# Patient Record
Sex: Female | Born: 1955 | Race: White | Hispanic: No | Marital: Married | Smoking: Former smoker
Health system: Southern US, Community
[De-identification: ages and names within clinical notes are randomized; demographics above are authoritative.]

## PROBLEM LIST (undated history)

## (undated) DIAGNOSIS — Z98811 Dental restoration status: Secondary | ICD-10-CM

## (undated) DIAGNOSIS — Z8719 Personal history of other diseases of the digestive system: Secondary | ICD-10-CM

## (undated) DIAGNOSIS — M1990 Unspecified osteoarthritis, unspecified site: Secondary | ICD-10-CM

## (undated) DIAGNOSIS — K219 Gastro-esophageal reflux disease without esophagitis: Secondary | ICD-10-CM

## (undated) DIAGNOSIS — M19012 Primary osteoarthritis, left shoulder: Secondary | ICD-10-CM

## (undated) HISTORY — PX: TOTAL SHOULDER REPLACEMENT: SUR1217

## (undated) HISTORY — PX: ABDOMINAL HYSTERECTOMY: SHX81

---

## 2000-04-27 ENCOUNTER — Other Ambulatory Visit: Admission: RE | Admit: 2000-04-27 | Discharge: 2000-04-27 | Payer: Self-pay | Admitting: Gynecology

## 2000-04-27 ENCOUNTER — Encounter: Admission: RE | Admit: 2000-04-27 | Discharge: 2000-04-27 | Payer: Self-pay | Admitting: Gynecology

## 2000-04-27 ENCOUNTER — Encounter: Payer: Self-pay | Admitting: Gynecology

## 2001-05-11 ENCOUNTER — Other Ambulatory Visit: Admission: RE | Admit: 2001-05-11 | Discharge: 2001-05-11 | Payer: Self-pay | Admitting: Gynecology

## 2001-05-11 ENCOUNTER — Encounter: Payer: Self-pay | Admitting: Gynecology

## 2001-05-11 ENCOUNTER — Encounter: Admission: RE | Admit: 2001-05-11 | Discharge: 2001-05-11 | Payer: Self-pay | Admitting: Gynecology

## 2001-08-26 ENCOUNTER — Ambulatory Visit (HOSPITAL_COMMUNITY): Admission: RE | Admit: 2001-08-26 | Discharge: 2001-08-26 | Payer: Self-pay

## 2001-08-26 ENCOUNTER — Encounter: Payer: Self-pay | Admitting: *Deleted

## 2002-06-22 ENCOUNTER — Other Ambulatory Visit: Admission: RE | Admit: 2002-06-22 | Discharge: 2002-06-22 | Payer: Self-pay | Admitting: Gynecology

## 2002-06-22 ENCOUNTER — Encounter: Payer: Self-pay | Admitting: Gynecology

## 2002-06-22 ENCOUNTER — Encounter: Admission: RE | Admit: 2002-06-22 | Discharge: 2002-06-22 | Payer: Self-pay | Admitting: Gynecology

## 2003-07-07 ENCOUNTER — Encounter: Payer: Self-pay | Admitting: Gynecology

## 2003-07-07 ENCOUNTER — Other Ambulatory Visit: Admission: RE | Admit: 2003-07-07 | Discharge: 2003-07-07 | Payer: Self-pay | Admitting: Gynecology

## 2003-07-07 ENCOUNTER — Encounter: Admission: RE | Admit: 2003-07-07 | Discharge: 2003-07-07 | Payer: Self-pay | Admitting: Gynecology

## 2004-08-12 ENCOUNTER — Other Ambulatory Visit: Admission: RE | Admit: 2004-08-12 | Discharge: 2004-08-12 | Payer: Self-pay | Admitting: Gynecology

## 2004-09-13 ENCOUNTER — Encounter: Admission: RE | Admit: 2004-09-13 | Discharge: 2004-09-13 | Payer: Self-pay | Admitting: Gynecology

## 2005-09-10 ENCOUNTER — Other Ambulatory Visit: Admission: RE | Admit: 2005-09-10 | Discharge: 2005-09-10 | Payer: Self-pay | Admitting: Gynecology

## 2005-12-16 ENCOUNTER — Encounter: Admission: RE | Admit: 2005-12-16 | Discharge: 2005-12-16 | Payer: Self-pay | Admitting: Gynecology

## 2006-10-12 ENCOUNTER — Other Ambulatory Visit: Admission: RE | Admit: 2006-10-12 | Discharge: 2006-10-12 | Payer: Self-pay | Admitting: Gynecology

## 2006-11-12 ENCOUNTER — Ambulatory Visit (HOSPITAL_COMMUNITY): Admission: RE | Admit: 2006-11-12 | Discharge: 2006-11-12 | Payer: Self-pay | Admitting: Family Medicine

## 2007-02-12 ENCOUNTER — Encounter: Admission: RE | Admit: 2007-02-12 | Discharge: 2007-02-12 | Payer: Self-pay | Admitting: Gynecology

## 2008-02-25 ENCOUNTER — Encounter: Admission: RE | Admit: 2008-02-25 | Discharge: 2008-02-25 | Payer: Self-pay | Admitting: Gynecology

## 2008-06-09 ENCOUNTER — Ambulatory Visit (HOSPITAL_BASED_OUTPATIENT_CLINIC_OR_DEPARTMENT_OTHER): Admission: RE | Admit: 2008-06-09 | Discharge: 2008-06-09 | Payer: Self-pay | Admitting: Gynecology

## 2008-06-09 ENCOUNTER — Encounter (INDEPENDENT_AMBULATORY_CARE_PROVIDER_SITE_OTHER): Payer: Self-pay | Admitting: Gynecology

## 2008-06-09 HISTORY — PX: HYSTEROSCOPY W/ ENDOMETRIAL ABLATION: SUR665

## 2009-03-30 ENCOUNTER — Encounter: Admission: RE | Admit: 2009-03-30 | Discharge: 2009-03-30 | Payer: Self-pay | Admitting: Gynecology

## 2009-12-04 ENCOUNTER — Emergency Department (HOSPITAL_COMMUNITY): Admission: EM | Admit: 2009-12-04 | Discharge: 2009-12-05 | Payer: Self-pay | Admitting: Emergency Medicine

## 2009-12-07 ENCOUNTER — Encounter: Admission: RE | Admit: 2009-12-07 | Discharge: 2009-12-07 | Payer: Self-pay | Admitting: Gynecology

## 2009-12-17 ENCOUNTER — Ambulatory Visit (HOSPITAL_BASED_OUTPATIENT_CLINIC_OR_DEPARTMENT_OTHER): Admission: RE | Admit: 2009-12-17 | Discharge: 2009-12-18 | Payer: Self-pay | Admitting: Gynecology

## 2009-12-17 HISTORY — PX: SUPRACERVICAL ABDOMINAL HYSTERECTOMY: SHX5393

## 2009-12-17 HISTORY — PX: BILATERAL SALPINGOOPHORECTOMY: SHX1223

## 2010-07-18 ENCOUNTER — Encounter: Admission: RE | Admit: 2010-07-18 | Discharge: 2010-07-18 | Payer: Self-pay | Admitting: Gynecology

## 2010-11-24 ENCOUNTER — Encounter: Payer: Self-pay | Admitting: Family Medicine

## 2011-01-22 LAB — CBC
HCT: 37.7 % (ref 36.0–46.0)
Hemoglobin: 12.8 g/dL (ref 12.0–15.0)
MCHC: 34.1 g/dL (ref 30.0–36.0)
RBC: 4.67 MIL/uL (ref 3.87–5.11)

## 2011-01-22 LAB — POCT I-STAT, CHEM 8
Creatinine, Ser: 0.9 mg/dL (ref 0.4–1.2)
Glucose, Bld: 95 mg/dL (ref 70–99)
Hemoglobin: 12.9 g/dL (ref 12.0–15.0)
TCO2: 26 mmol/L (ref 0–100)

## 2011-01-22 LAB — DIFFERENTIAL
Eosinophils Absolute: 0.1 10*3/uL (ref 0.0–0.7)
Eosinophils Relative: 0 % (ref 0–5)
Lymphocytes Relative: 10 % — ABNORMAL LOW (ref 12–46)
Neutro Abs: 10.8 10*3/uL — ABNORMAL HIGH (ref 1.7–7.7)

## 2011-01-22 LAB — URINALYSIS, ROUTINE W REFLEX MICROSCOPIC
Nitrite: NEGATIVE
Specific Gravity, Urine: 1.015 (ref 1.005–1.030)
Urobilinogen, UA: 0.2 mg/dL (ref 0.0–1.0)
pH: 7 (ref 5.0–8.0)

## 2011-01-22 LAB — URINE MICROSCOPIC-ADD ON

## 2011-01-22 LAB — POCT PREGNANCY, URINE: Preg Test, Ur: NEGATIVE

## 2011-03-18 NOTE — Op Note (Signed)
NAME:  Laura Page, Laura Page NO.:  [id number]   MEDICAL RECORD NO.:  [id number]          PATIENT TYPE:  AMB   LOCATION:  [facility]                         FACILITY:  [facility]   PHYSICIAN:  Gretta Cool, M.D. DATE OF BIRTH:  01-Oct-1956   DATE OF PROCEDURE:  06/09/2008  DATE OF DISCHARGE:                               OPERATIVE REPORT   PREOPERATIVE DIAGNOSIS:  Abnormal uterine bleeding with polyps and  fibroids by ultrasound.  Benign endometrial pathology by sampling by  dilation and curettage.   POSTOPERATIVE DIAGNOSIS:  Abnormal uterine bleeding with polyps and  fibroids by ultrasound.  Benign endometrial pathology by sampling by  dilation and curettage.   OPERATION PERFORMED:  Hysterectomy, resection of endometrial polyps and  uterine leiomyomata.  Total endometrial resection for ablation.   SURGEON:  Gretta Cool, M.D.   ANESTHESIA:  IV sedation plus Xylocaine 1% paracervical block.   DESCRIPTION OF PROCEDURE:  Under excellent anesthesia as above with the  patient prepped and draped in Allen stirrups with the bladder drained by  rubber catheter, the cervix was grasped with a single toothed tenaculum  and pulled down into view.  It was progressively dilated with a series  of Pratt dilators to a 33.  At this point the 7 mm hysteroscope was  introduced and the cavity photographed.  Large polyps were noted.  They  were progressively resected.  During the resection a large fibroid was  also encountered and removed in total.  At the end of the procedure,  with the endometrial tissue resected and no evidence of residual  endometrial tissue, VaporTrode was applied and the entire cavity treated  to eliminate any superficial unseen islands of endometriosis of  adenomyosis.  At this point the procedure was terminated without  significant bleeding at reduced pressure with cavity cleaned of debris.  The patient was then returned to the recovery room in excellent   condition.           ______________________________  Gretta Cool, M.D.     CWL/MEDQ  D:  06/09/2008  T:  06/09/2008  Job:  [id number]

## 2011-06-03 IMAGING — US US PELVIS COMPLETE MODIFY
1 series · 14 of 25 positions shown · non-contrast
Comparison: None.

CLINICAL DATA: Pelvic pain.

TRANSABDOMINAL AND TRANSVAGINAL ULTRASOUND OF PELVIS
TECHNIQUE: Both transabdominal and transvaginal ultrasound
examinations of the pelvis were performed including evaluation of
the uterus, ovaries, adnexal regions, and pelvic cul-de-sac.

[Series 1: us pelvis complete modify · 0.33mm/px · 14 of 33 slices shown]
[im 1/33]
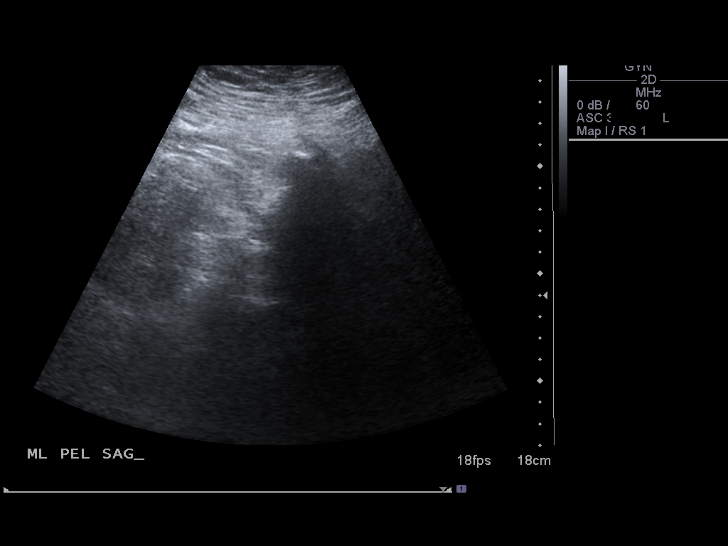
[im 3/33]
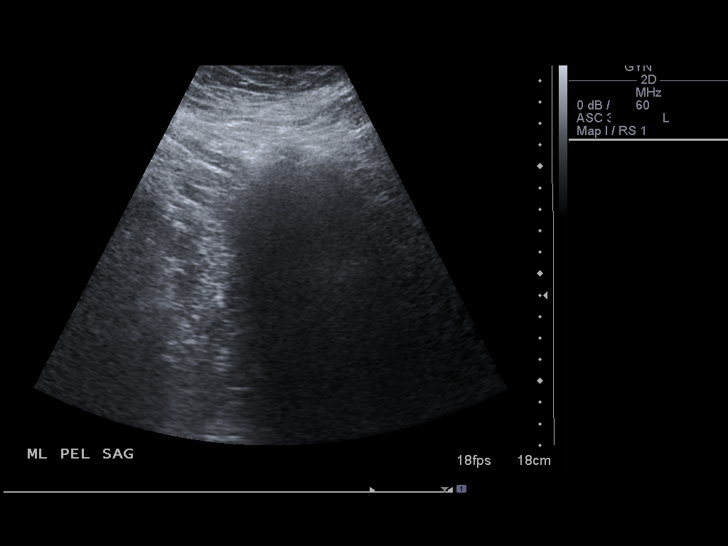
[im 6/33]
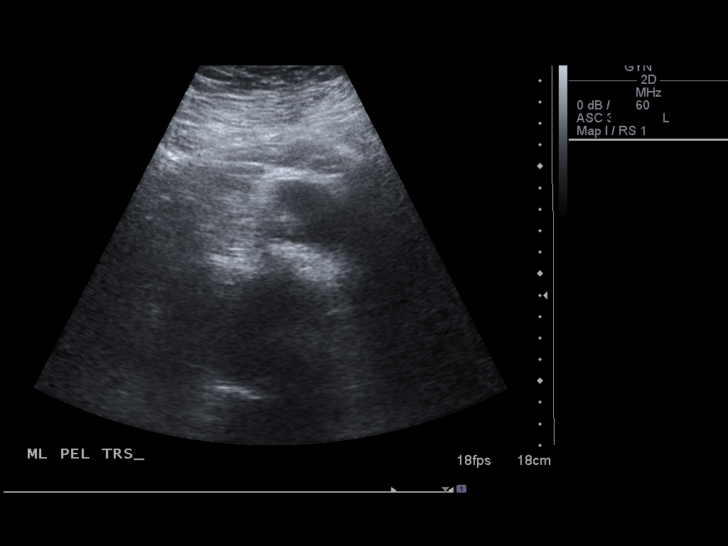
[im 9/33]
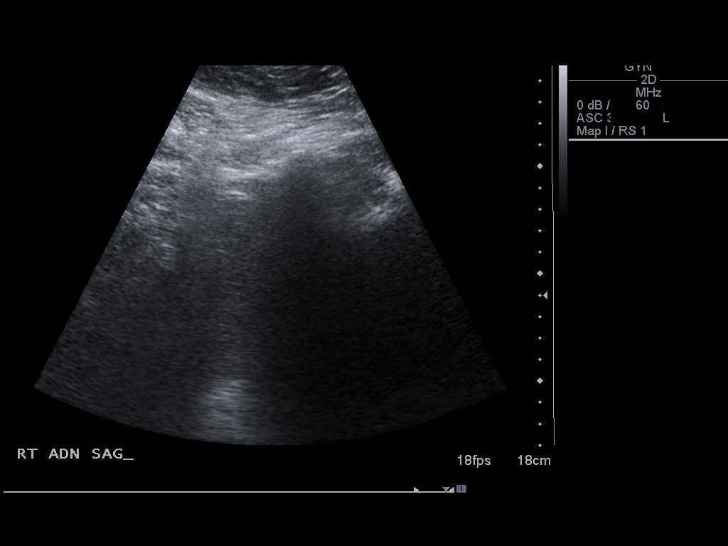
[im 11/33]
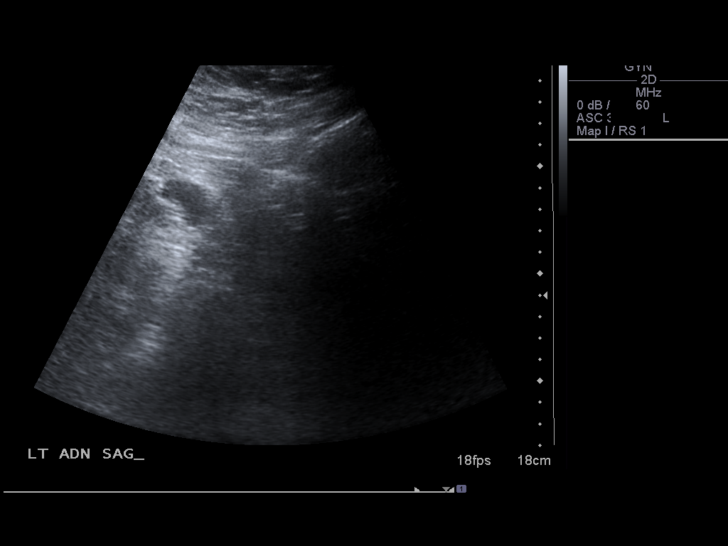
[im 13/33]
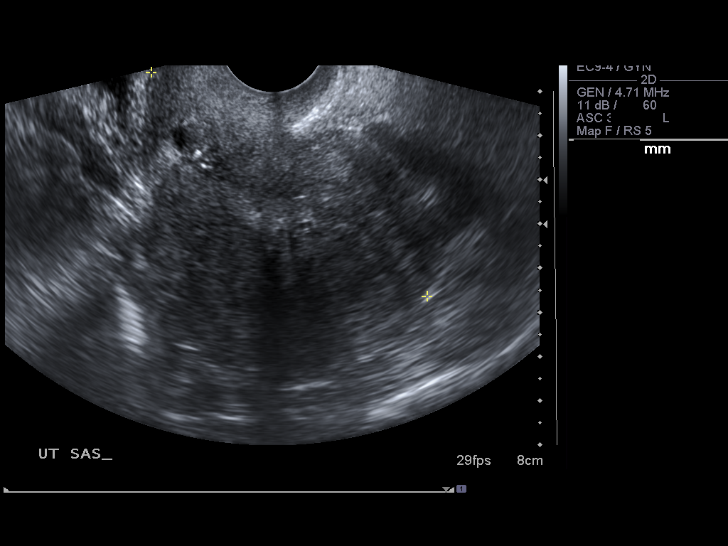
[im 15/33]
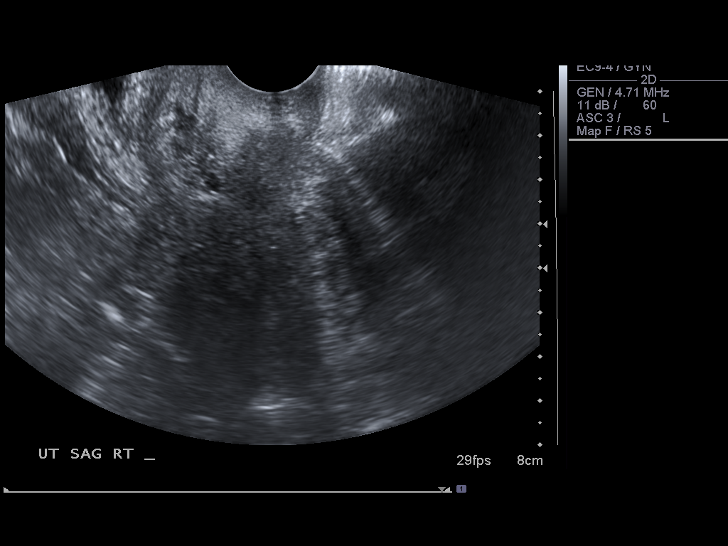
[im 18/33]
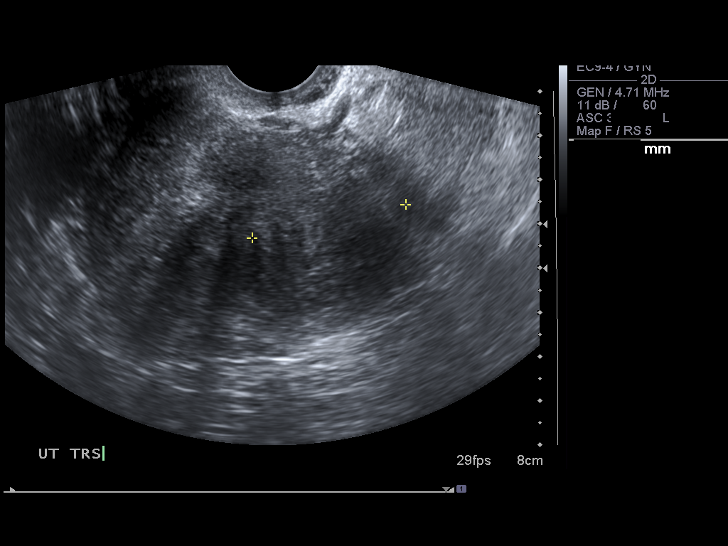
[im 21/33]
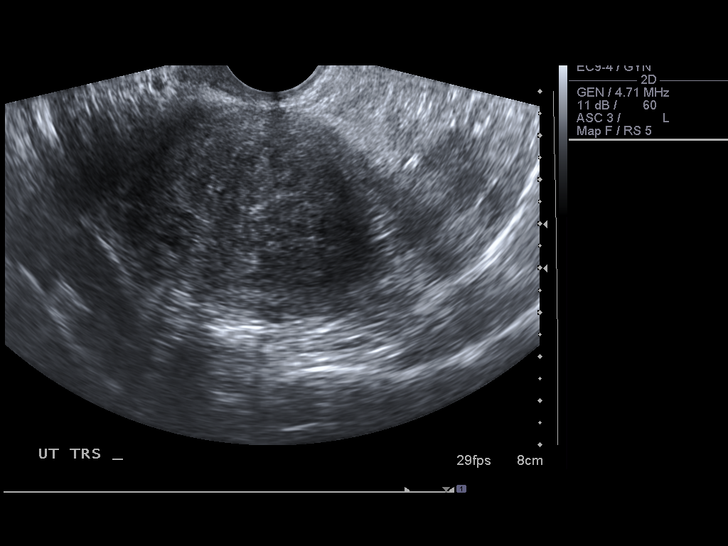
[im 22/33]
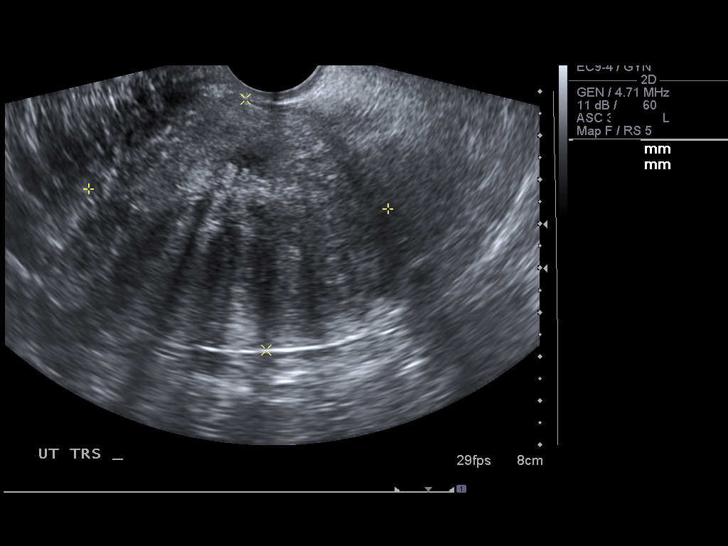
[im 25/33]
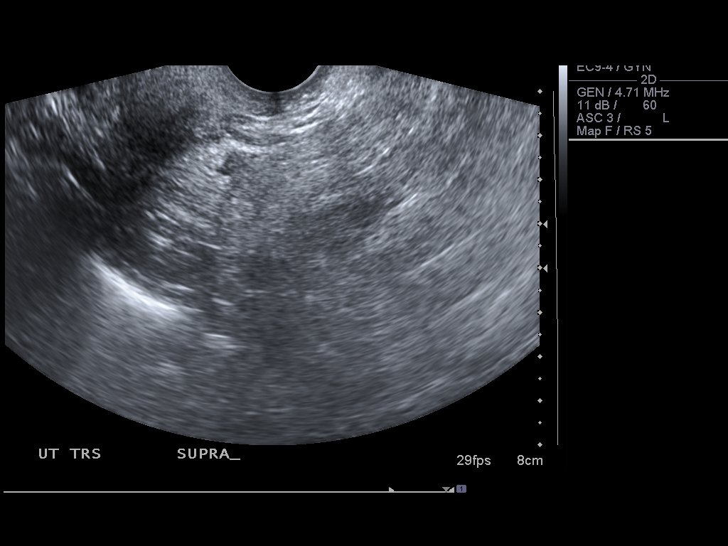
[im 27/33]
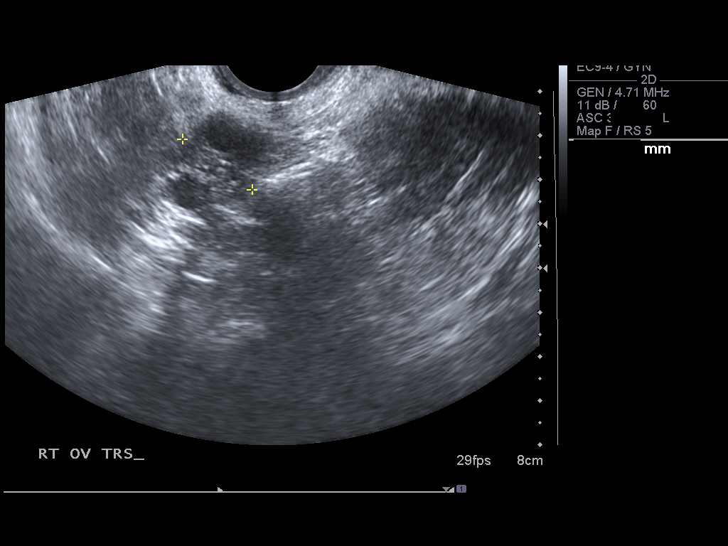
[im 30/33]
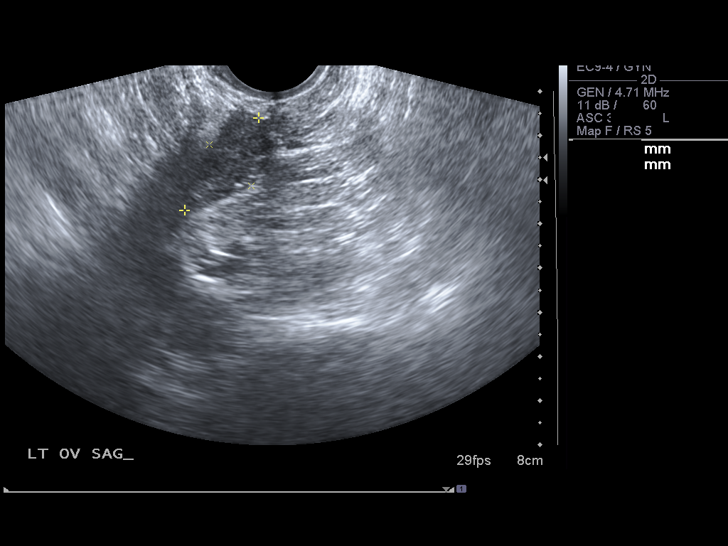
[im 33/33]
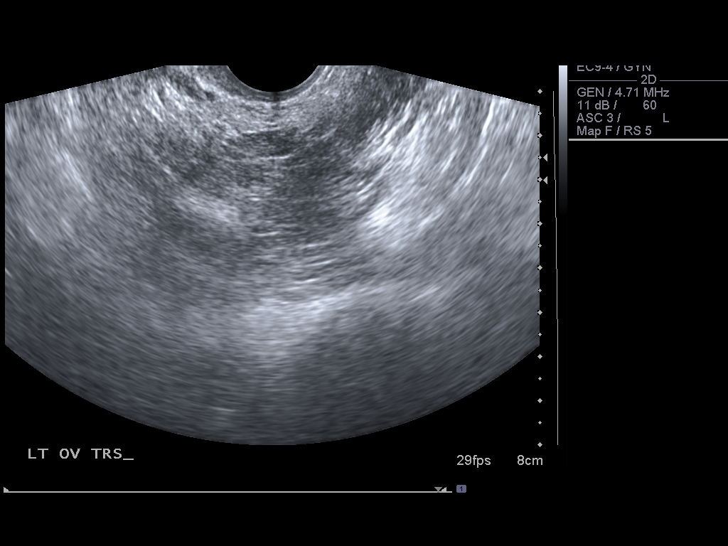

[14 of 25 positions shown; findings below may reference images not displayed]

FINDINGS: Uterus 8 x 5.7 x 6.8 cm.  Fibroid suspected measuring up to 4.6 cm.

Endometrium slightly heterogeneous endometrial lining measuring up
to 5.6 mm.

Right Ovary 3.4 x 2.5 x 1.9 cm.  Small follicles without dominant
mass.

Left Ovary 2.7 x 1.3 x 1.8 cm.  No dominant mass.

Other Findings:  No free fluid.
IMPRESSION: Exam limited by patient's habitus.  Findings suggestive of fibroids
largest measuring up to 4.6 cm.

Slightly heterogeneous endometrial lining with maximal thickness
5.6 mm which is within range normal limits.

## 2011-06-23 ENCOUNTER — Other Ambulatory Visit: Payer: Self-pay | Admitting: Gynecology

## 2011-06-23 DIAGNOSIS — Z1231 Encounter for screening mammogram for malignant neoplasm of breast: Secondary | ICD-10-CM

## 2011-08-01 LAB — POCT HEMOGLOBIN-HEMACUE: Hemoglobin: 10.2 — ABNORMAL LOW

## 2011-08-11 ENCOUNTER — Ambulatory Visit
Admission: RE | Admit: 2011-08-11 | Discharge: 2011-08-11 | Disposition: A | Discharge status: Home / Self Care | Payer: PRIVATE HEALTH INSURANCE | Source: Ambulatory Visit | Attending: Gynecology | Admitting: Gynecology

## 2011-08-11 ENCOUNTER — Other Ambulatory Visit: Payer: Self-pay | Admitting: Gynecology

## 2011-08-11 DIAGNOSIS — Z1231 Encounter for screening mammogram for malignant neoplasm of breast: Secondary | ICD-10-CM

## 2012-08-10 ENCOUNTER — Other Ambulatory Visit: Payer: Self-pay | Admitting: Gynecology

## 2012-08-10 DIAGNOSIS — Z1231 Encounter for screening mammogram for malignant neoplasm of breast: Secondary | ICD-10-CM

## 2012-08-23 ENCOUNTER — Other Ambulatory Visit: Payer: Self-pay | Admitting: Gynecology

## 2012-08-23 DIAGNOSIS — E8940 Asymptomatic postprocedural ovarian failure: Secondary | ICD-10-CM

## 2012-09-15 ENCOUNTER — Ambulatory Visit
Admission: RE | Admit: 2012-09-15 | Discharge: 2012-09-15 | Disposition: A | Discharge status: Home / Self Care | Payer: BC Managed Care – PPO | Source: Ambulatory Visit | Attending: Gynecology | Admitting: Gynecology

## 2012-09-15 DIAGNOSIS — Z1231 Encounter for screening mammogram for malignant neoplasm of breast: Secondary | ICD-10-CM

## 2012-09-15 DIAGNOSIS — E8940 Asymptomatic postprocedural ovarian failure: Secondary | ICD-10-CM

## 2013-09-06 ENCOUNTER — Other Ambulatory Visit: Payer: Self-pay

## 2013-09-06 DIAGNOSIS — Z1231 Encounter for screening mammogram for malignant neoplasm of breast: Secondary | ICD-10-CM

## 2013-09-30 ENCOUNTER — Ambulatory Visit
Admission: RE | Admit: 2013-09-30 | Discharge: 2013-09-30 | Disposition: A | Discharge status: Home / Self Care | Payer: BC Managed Care – PPO | Source: Ambulatory Visit

## 2013-09-30 DIAGNOSIS — Z1231 Encounter for screening mammogram for malignant neoplasm of breast: Secondary | ICD-10-CM

## 2014-09-21 ENCOUNTER — Other Ambulatory Visit: Payer: Self-pay

## 2014-09-21 DIAGNOSIS — Z1231 Encounter for screening mammogram for malignant neoplasm of breast: Secondary | ICD-10-CM

## 2015-09-26 ENCOUNTER — Other Ambulatory Visit (HOSPITAL_BASED_OUTPATIENT_CLINIC_OR_DEPARTMENT_OTHER): Payer: Self-pay | Admitting: Family Medicine

## 2015-09-26 DIAGNOSIS — R42 Dizziness and giddiness: Secondary | ICD-10-CM

## 2015-09-26 DIAGNOSIS — R51 Headache: Secondary | ICD-10-CM

## 2015-09-26 DIAGNOSIS — R519 Headache, unspecified: Secondary | ICD-10-CM

## 2015-09-29 ENCOUNTER — Ambulatory Visit (HOSPITAL_BASED_OUTPATIENT_CLINIC_OR_DEPARTMENT_OTHER)
Admission: RE | Admit: 2015-09-29 | Discharge: 2015-09-29 | Disposition: A | Discharge status: Home / Self Care | Payer: 59 | Source: Ambulatory Visit | Attending: Family Medicine | Admitting: Family Medicine

## 2015-09-29 DIAGNOSIS — R110 Nausea: Secondary | ICD-10-CM | POA: Insufficient documentation

## 2015-09-29 DIAGNOSIS — R519 Headache, unspecified: Secondary | ICD-10-CM

## 2015-09-29 DIAGNOSIS — R42 Dizziness and giddiness: Secondary | ICD-10-CM | POA: Diagnosis present

## 2015-09-29 DIAGNOSIS — R51 Headache: Secondary | ICD-10-CM | POA: Diagnosis not present

## 2015-09-29 DIAGNOSIS — R9082 White matter disease, unspecified: Secondary | ICD-10-CM | POA: Diagnosis not present

## 2015-09-29 MED ORDER — GADOBENATE DIMEGLUMINE 529 MG/ML IV SOLN
20.0000 mL | Freq: Once | INTRAVENOUS | Status: AC | PRN
  Administered 2015-09-29: 20 mL via INTRAVENOUS

## 2015-10-18 ENCOUNTER — Other Ambulatory Visit: Payer: Self-pay | Admitting: Gastroenterology

## 2016-11-03 DIAGNOSIS — M19012 Primary osteoarthritis, left shoulder: Secondary | ICD-10-CM

## 2016-11-03 HISTORY — DX: Primary osteoarthritis, left shoulder: M19.012

## 2016-11-10 NOTE — H&P (Signed)
This is a pleasant 61 year old female who presents to our clinic today with left shoulder pain. This has been ongoing for the past 2 years; insidious onset, no known injury. She feels pain deep in the socket, she says. She is having resting night pain and pain with any motion to include external and internal rotation, as well as forward flexion. When she brings her arm across her body it seems to somewhat alleviate her pain for the time being. She has tried Advil without relief of symptoms. No numbness, tingling, or burning down her arm. No history of surgical intervention or cortisone injection in the past. Of note, she does state her brother had a total shoulder replacement with Dr. Richardson Landryan Murphy back in December 2014 and is doing well with this.   Past medical history: Significant for glasses. Negative for diabetes, hypertension, heart disease, arthritis.  Allergies; No known drug allergies.  Medications: Prilosec as needed.  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 She is married. She is a nonsmoker. She drinks one alcoholic beverage per week.    Family history: Negative for diabetes, hypertension, heart disease, arthritis.  Review of systems as detailed on HPI; all others reviewed and are negative.    EXAMINATION: Well-developed, well-nourished female in no acute distress.  Alert and oriented x 3. Height 5'8". Weight 230 pounds. BP 126/94. Heart rate 80.  Examination of her left shoulder reveals 50% active range of motion in all planes. She can internally rotate to her back pocket. Positive empty can. The patient is neurovascularly intact distally.   X-RAYS: X-rays of her cervical spine reveal moderate degenerative changes, degenerative disc disease throughout.   X-rays of her shoulder reveal type 2 acromion, moderate AC changes, severe glenohumeral osteoarthritis with associated loose body.   IMPRESSION: Primary localized osteoarthritis, left shoulder.  PLAN: Proceed with 2:6  Depo Medrol/Marcaine injection into the intraarticular space on the left.   Of note, Santina EvansCatherine feels a lot better 10 minutes following the injection however her motion is about the same. We are going to go ahead and get an MRI of her left shoulder to assess her rotator cuff. In the meantime, we are going to fill out paperwork to proceed with a left total shoulder replacement. Risks, benefits, possible complications reviewed. Rehab was discussed. All questions were answered, paperwork completed. We will be in touch with Santina Evansatherine following the MRI.   MRI reveals severe glenohumeral joint osteoarthritis

## 2016-11-13 ENCOUNTER — Encounter (HOSPITAL_BASED_OUTPATIENT_CLINIC_OR_DEPARTMENT_OTHER): Payer: Self-pay | Admitting: *Deleted

## 2016-11-14 ENCOUNTER — Encounter (HOSPITAL_BASED_OUTPATIENT_CLINIC_OR_DEPARTMENT_OTHER)
Admission: RE | Admit: 2016-11-14 | Discharge: 2016-11-14 | Disposition: A | Discharge status: Home / Self Care | Payer: 59 | Source: Ambulatory Visit | Attending: Orthopedic Surgery | Admitting: Orthopedic Surgery

## 2016-11-14 ENCOUNTER — Other Ambulatory Visit: Payer: Self-pay

## 2016-11-14 ENCOUNTER — Encounter (HOSPITAL_BASED_OUTPATIENT_CLINIC_OR_DEPARTMENT_OTHER): Payer: Self-pay | Admitting: *Deleted

## 2016-11-14 DIAGNOSIS — M19012 Primary osteoarthritis, left shoulder: Secondary | ICD-10-CM | POA: Diagnosis not present

## 2016-11-14 DIAGNOSIS — Z01810 Encounter for preprocedural cardiovascular examination: Secondary | ICD-10-CM | POA: Diagnosis not present

## 2016-11-14 DIAGNOSIS — Z01812 Encounter for preprocedural laboratory examination: Secondary | ICD-10-CM | POA: Insufficient documentation

## 2016-11-14 LAB — SURGICAL PCR SCREEN
MRSA, PCR: NEGATIVE
Staphylococcus aureus: NEGATIVE

## 2016-11-14 NOTE — Pre-Procedure Instructions (Signed)
To come for EKG and PCR screen

## 2016-11-17 NOTE — Progress Notes (Signed)
PCR negative result 11/14/16.

## 2016-11-27 ENCOUNTER — Ambulatory Visit (HOSPITAL_BASED_OUTPATIENT_CLINIC_OR_DEPARTMENT_OTHER): Payer: 59 | Admitting: Anesthesiology

## 2016-11-27 ENCOUNTER — Encounter (HOSPITAL_BASED_OUTPATIENT_CLINIC_OR_DEPARTMENT_OTHER)
Admission: RE | Disposition: A | Discharge status: Home / Self Care | Payer: Self-pay | Source: Ambulatory Visit | Attending: Orthopedic Surgery

## 2016-11-27 ENCOUNTER — Ambulatory Visit (HOSPITAL_BASED_OUTPATIENT_CLINIC_OR_DEPARTMENT_OTHER)
Admission: RE | Admit: 2016-11-27 | Discharge: 2016-11-28 | Disposition: A | Discharge status: Home / Self Care | Payer: 59 | Source: Ambulatory Visit | Attending: Orthopedic Surgery | Admitting: Orthopedic Surgery

## 2016-11-27 ENCOUNTER — Encounter (HOSPITAL_BASED_OUTPATIENT_CLINIC_OR_DEPARTMENT_OTHER): Payer: Self-pay | Admitting: *Deleted

## 2016-11-27 DIAGNOSIS — Z79899 Other long term (current) drug therapy: Secondary | ICD-10-CM | POA: Insufficient documentation

## 2016-11-27 DIAGNOSIS — E669 Obesity, unspecified: Secondary | ICD-10-CM | POA: Insufficient documentation

## 2016-11-27 DIAGNOSIS — Z6835 Body mass index (BMI) 35.0-35.9, adult: Secondary | ICD-10-CM | POA: Insufficient documentation

## 2016-11-27 DIAGNOSIS — K219 Gastro-esophageal reflux disease without esophagitis: Secondary | ICD-10-CM | POA: Diagnosis not present

## 2016-11-27 DIAGNOSIS — M19012 Primary osteoarthritis, left shoulder: Secondary | ICD-10-CM | POA: Diagnosis present

## 2016-11-27 DIAGNOSIS — Z87891 Personal history of nicotine dependence: Secondary | ICD-10-CM | POA: Insufficient documentation

## 2016-11-27 DIAGNOSIS — M7582 Other shoulder lesions, left shoulder: Secondary | ICD-10-CM | POA: Insufficient documentation

## 2016-11-27 HISTORY — DX: Unspecified osteoarthritis, unspecified site: M19.90

## 2016-11-27 HISTORY — DX: Gastro-esophageal reflux disease without esophagitis: K21.9

## 2016-11-27 HISTORY — DX: Dental restoration status: Z98.811

## 2016-11-27 HISTORY — PX: TOTAL SHOULDER ARTHROPLASTY: SHX126

## 2016-11-27 HISTORY — DX: Primary osteoarthritis, left shoulder: M19.012

## 2016-11-27 LAB — POCT HEMOGLOBIN-HEMACUE: Hemoglobin: 14.9 g/dL (ref 12.0–15.0)

## 2016-11-27 SURGERY — ARTHROPLASTY, SHOULDER, TOTAL
Anesthesia: Regional | Site: Shoulder | Laterality: Left

## 2016-11-27 MED ORDER — METHOCARBAMOL 500 MG PO TABS
500.0000 mg | ORAL_TABLET | Freq: Four times a day (QID) | ORAL | Status: DC | PRN
  Administered 2016-11-27 – 2016-11-28 (×3): 500 mg via ORAL
  Filled 2016-11-27 (×3): qty 1

## 2016-11-27 MED ORDER — METOCLOPRAMIDE HCL 5 MG PO TABS: 5.0000 mg | ORAL_TABLET | Freq: Three times a day (TID) | ORAL | Status: DC | PRN

## 2016-11-27 MED ORDER — BUPIVACAINE-EPINEPHRINE (PF) 0.5% -1:200000 IJ SOLN
INTRAMUSCULAR | Status: DC | PRN
  Administered 2016-11-27: 30 mL via PERINEURAL

## 2016-11-27 MED ORDER — HYDROMORPHONE HCL 1 MG/ML IJ SOLN
INTRAMUSCULAR | Status: AC
  Filled 2016-11-27: qty 1

## 2016-11-27 MED ORDER — METOCLOPRAMIDE HCL 5 MG/ML IJ SOLN: 5.0000 mg | Freq: Three times a day (TID) | INTRAMUSCULAR | Status: DC | PRN

## 2016-11-27 MED ORDER — EPHEDRINE SULFATE 50 MG/ML IJ SOLN
INTRAMUSCULAR | Status: DC | PRN
  Administered 2016-11-27 (×2): 10 mg via INTRAVENOUS

## 2016-11-27 MED ORDER — LACTATED RINGERS IV SOLN
INTRAVENOUS | Status: DC
  Administered 2016-11-27: 07:00:00 via INTRAVENOUS

## 2016-11-27 MED ORDER — MIDAZOLAM HCL 2 MG/2ML IJ SOLN: 0.5000 mg | Freq: Once | INTRAMUSCULAR | Status: DC | PRN

## 2016-11-27 MED ORDER — BISACODYL 5 MG PO TBEC: 5.0000 mg | DELAYED_RELEASE_TABLET | Freq: Every day | ORAL | Status: DC | PRN

## 2016-11-27 MED ORDER — PROPOFOL 500 MG/50ML IV EMUL
INTRAVENOUS | Status: AC
  Filled 2016-11-27: qty 50

## 2016-11-27 MED ORDER — KETOROLAC TROMETHAMINE 30 MG/ML IJ SOLN
INTRAMUSCULAR | Status: AC
  Filled 2016-11-27: qty 1

## 2016-11-27 MED ORDER — METHOCARBAMOL 500 MG PO TABS
500.0000 mg | ORAL_TABLET | Freq: Four times a day (QID) | ORAL | 0 refills | Status: DC
Start: 2016-11-27 — End: 2019-10-20

## 2016-11-27 MED ORDER — ONDANSETRON HCL 4 MG/2ML IJ SOLN
4.0000 mg | Freq: Four times a day (QID) | INTRAMUSCULAR | Status: DC | PRN
  Administered 2016-11-27: 4 mg via INTRAVENOUS
  Filled 2016-11-27: qty 2

## 2016-11-27 MED ORDER — KETOROLAC TROMETHAMINE 30 MG/ML IJ SOLN
30.0000 mg | Freq: Once | INTRAMUSCULAR | Status: AC | PRN
  Administered 2016-11-27: 30 mg via INTRAVENOUS

## 2016-11-27 MED ORDER — MEPERIDINE HCL 25 MG/ML IJ SOLN: 6.2500 mg | INTRAMUSCULAR | Status: DC | PRN

## 2016-11-27 MED ORDER — ONDANSETRON HCL 4 MG PO TABS: 4.0000 mg | ORAL_TABLET | Freq: Three times a day (TID) | ORAL | 0 refills | Status: DC | PRN

## 2016-11-27 MED ORDER — POTASSIUM CHLORIDE IN NACL 20-0.9 MEQ/L-% IV SOLN
INTRAVENOUS | Status: DC
  Administered 2016-11-27: 12:00:00 via INTRAVENOUS
  Filled 2016-11-27: qty 1000

## 2016-11-27 MED ORDER — POLYETHYLENE GLYCOL 3350 17 G PO PACK: 17.0000 g | PACK | Freq: Every day | ORAL | Status: DC | PRN

## 2016-11-27 MED ORDER — HYDROMORPHONE HCL 1 MG/ML IJ SOLN
0.2500 mg | INTRAMUSCULAR | Status: DC | PRN
  Administered 2016-11-27 (×3): 0.5 mg via INTRAVENOUS

## 2016-11-27 MED ORDER — CHLORHEXIDINE GLUCONATE 4 % EX LIQD: 60.0000 mL | Freq: Once | CUTANEOUS | Status: DC

## 2016-11-27 MED ORDER — OXYCODONE-ACETAMINOPHEN 5-325 MG PO TABS: 1.0000 | ORAL_TABLET | ORAL | 0 refills | Status: DC | PRN

## 2016-11-27 MED ORDER — ONDANSETRON HCL 4 MG/2ML IJ SOLN
INTRAMUSCULAR | Status: AC
Start: 2016-11-27 — End: 2016-11-27
  Filled 2016-11-27: qty 2

## 2016-11-27 MED ORDER — DEXAMETHASONE SODIUM PHOSPHATE 10 MG/ML IJ SOLN
INTRAMUSCULAR | Status: AC
  Filled 2016-11-27: qty 1

## 2016-11-27 MED ORDER — CEFAZOLIN SODIUM-DEXTROSE 2-4 GM/100ML-% IV SOLN
INTRAVENOUS | Status: AC
  Filled 2016-11-27: qty 100

## 2016-11-27 MED ORDER — PROMETHAZINE HCL 25 MG/ML IJ SOLN: 6.2500 mg | INTRAMUSCULAR | Status: DC | PRN

## 2016-11-27 MED ORDER — LIDOCAINE HCL (CARDIAC) 20 MG/ML IV SOLN
INTRAVENOUS | Status: DC | PRN
  Administered 2016-11-27: 50 mg via INTRAVENOUS

## 2016-11-27 MED ORDER — FENTANYL CITRATE (PF) 100 MCG/2ML IJ SOLN
50.0000 ug | INTRAMUSCULAR | Status: AC | PRN
  Administered 2016-11-27 (×2): 25 ug via INTRAVENOUS
  Administered 2016-11-27 (×2): 50 ug via INTRAVENOUS

## 2016-11-27 MED ORDER — METHOCARBAMOL 1000 MG/10ML IJ SOLN: 500.0000 mg | Freq: Four times a day (QID) | INTRAVENOUS | Status: DC | PRN

## 2016-11-27 MED ORDER — MIDAZOLAM HCL 2 MG/2ML IJ SOLN
INTRAMUSCULAR | Status: AC
  Filled 2016-11-27: qty 2

## 2016-11-27 MED ORDER — DEXAMETHASONE SODIUM PHOSPHATE 4 MG/ML IJ SOLN
INTRAMUSCULAR | Status: DC | PRN
  Administered 2016-11-27: 10 mg via INTRAVENOUS

## 2016-11-27 MED ORDER — CEFAZOLIN SODIUM-DEXTROSE 2-4 GM/100ML-% IV SOLN
2.0000 g | INTRAVENOUS | Status: AC
  Administered 2016-11-27: 2 g via INTRAVENOUS

## 2016-11-27 MED ORDER — SCOPOLAMINE 1 MG/3DAYS TD PT72: 1.0000 | MEDICATED_PATCH | Freq: Once | TRANSDERMAL | Status: DC | PRN

## 2016-11-27 MED ORDER — OXYCODONE-ACETAMINOPHEN 5-325 MG PO TABS
1.0000 | ORAL_TABLET | ORAL | Status: DC | PRN
  Administered 2016-11-27 – 2016-11-28 (×4): 2 via ORAL
  Filled 2016-11-27 (×4): qty 2

## 2016-11-27 MED ORDER — PANTOPRAZOLE SODIUM 40 MG PO TBEC
40.0000 mg | DELAYED_RELEASE_TABLET | Freq: Every day | ORAL | Status: DC
  Administered 2016-11-27: 40 mg via ORAL
  Filled 2016-11-27: qty 1

## 2016-11-27 MED ORDER — HYDROMORPHONE HCL 1 MG/ML IJ SOLN: 0.5000 mg | INTRAMUSCULAR | Status: DC | PRN

## 2016-11-27 MED ORDER — MAGNESIUM CITRATE PO SOLN: 1.0000 | Freq: Once | ORAL | Status: DC | PRN

## 2016-11-27 MED ORDER — MIDAZOLAM HCL 2 MG/2ML IJ SOLN
1.0000 mg | INTRAMUSCULAR | Status: DC | PRN
  Administered 2016-11-27: 1 mg via INTRAVENOUS

## 2016-11-27 MED ORDER — FENTANYL CITRATE (PF) 100 MCG/2ML IJ SOLN
INTRAMUSCULAR | Status: AC
  Filled 2016-11-27: qty 2

## 2016-11-27 MED ORDER — ONDANSETRON HCL 4 MG PO TABS
4.0000 mg | ORAL_TABLET | Freq: Four times a day (QID) | ORAL | Status: DC | PRN
  Filled 2016-11-27: qty 1

## 2016-11-27 MED ORDER — PROPOFOL 10 MG/ML IV BOLUS
INTRAVENOUS | Status: DC | PRN
  Administered 2016-11-27: 200 mg via INTRAVENOUS

## 2016-11-27 MED ORDER — LACTATED RINGERS IV SOLN: INTRAVENOUS | Status: DC

## 2016-11-27 MED ORDER — SUCCINYLCHOLINE CHLORIDE 20 MG/ML IJ SOLN
INTRAMUSCULAR | Status: DC | PRN
  Administered 2016-11-27: 120 mg via INTRAVENOUS

## 2016-11-27 MED ORDER — LIDOCAINE 2% (20 MG/ML) 5 ML SYRINGE
INTRAMUSCULAR | Status: AC
  Filled 2016-11-27: qty 5

## 2016-11-27 SURGICAL SUPPLY — 69 items
ANCH SUT SWLK 19.1X4.75 (Anchor) ×1 IMPLANT
ANCHOR SUT BIO SW 4.75X19.1 (Anchor) ×2 IMPLANT
APL SKNCLS STERI-STRIP NONHPOA (GAUZE/BANDAGES/DRESSINGS) ×1
BENZOIN TINCTURE PRP APPL 2/3 (GAUZE/BANDAGES/DRESSINGS) ×3 IMPLANT
BLADE SAW SAG 29X58X.64 (BLADE) ×2 IMPLANT
BLADE SAW SGTL 83.5X18.5 (BLADE) ×1 IMPLANT
BLADE SURG 10 STRL SS (BLADE) ×3 IMPLANT
BLADE SURG 15 STRL LF DISP TIS (BLADE) ×2 IMPLANT
BLADE SURG 15 STRL SS (BLADE) ×6
BNDG GAUZE ELAST 4 BULKY (GAUZE/BANDAGES/DRESSINGS) ×3 IMPLANT
BOWL SMART MIX CTS (DISPOSABLE) ×3 IMPLANT
CAPT SHLDR TOTAL 2 ×2 IMPLANT
CEMENT BONE SIMPLEX SPEEDSET (Cement) ×3 IMPLANT
CLEANER CAUTERY TIP 5X5 PAD (MISCELLANEOUS) ×1 IMPLANT
CLOSURE WOUND 1/2 X4 (GAUZE/BANDAGES/DRESSINGS) ×1
COVER BACK TABLE 60X90IN (DRAPES) ×3 IMPLANT
COVER MAYO STAND STRL (DRAPES) ×3 IMPLANT
DECANTER SPIKE VIAL GLASS SM (MISCELLANEOUS) IMPLANT
DRAPE U-SHAPE 47X51 STRL (DRAPES) ×3 IMPLANT
DRAPE U-SHAPE 76X120 STRL (DRAPES) ×6 IMPLANT
DRSG MEPILEX BORDER 4X8 (GAUZE/BANDAGES/DRESSINGS) ×2 IMPLANT
DURAPREP 26ML APPLICATOR (WOUND CARE) ×3 IMPLANT
ELECT REM PT RETURN 9FT ADLT (ELECTROSURGICAL) ×3
ELECTRODE REM PT RTRN 9FT ADLT (ELECTROSURGICAL) ×1 IMPLANT
FACESHIELD WRAPAROUND (MASK) ×3 IMPLANT
FACESHIELD WRAPAROUND OR TEAM (MASK) ×1 IMPLANT
GAUZE SPONGE 4X4 12PLY STRL (GAUZE/BANDAGES/DRESSINGS) ×2 IMPLANT
GAUZE XEROFORM 1X8 LF (GAUZE/BANDAGES/DRESSINGS) IMPLANT
GLOVE BIOGEL PI IND STRL 7.0 (GLOVE) ×1 IMPLANT
GLOVE BIOGEL PI INDICATOR 7.0 (GLOVE) ×2
GLOVE ECLIPSE 6.5 STRL STRAW (GLOVE) ×6 IMPLANT
GLOVE ECLIPSE 7.0 STRL STRAW (GLOVE) ×3 IMPLANT
GLOVE SURG ORTHO 8.0 STRL STRW (GLOVE) ×3 IMPLANT
GOWN STRL REUS W/ TWL LRG LVL3 (GOWN DISPOSABLE) ×1 IMPLANT
GOWN STRL REUS W/ TWL XL LVL3 (GOWN DISPOSABLE) ×2 IMPLANT
GOWN STRL REUS W/TWL LRG LVL3 (GOWN DISPOSABLE) ×3
GOWN STRL REUS W/TWL XL LVL3 (GOWN DISPOSABLE) ×6
NDL 1/2 CIR CATGUT .05X1.09 (NEEDLE) IMPLANT
NEEDLE 1/2 CIR CATGUT .05X1.09 (NEEDLE) IMPLANT
NS IRRIG 1000ML POUR BTL (IV SOLUTION) ×4 IMPLANT
PACK ARTHROSCOPY DSU (CUSTOM PROCEDURE TRAY) ×3 IMPLANT
PACK BASIN DAY SURGERY FS (CUSTOM PROCEDURE TRAY) ×3 IMPLANT
PAD CLEANER CAUTERY TIP 5X5 (MISCELLANEOUS) ×2
PENCIL BUTTON HOLSTER BLD 10FT (ELECTRODE) ×3 IMPLANT
SLEEVE SCD COMPRESS KNEE MED (MISCELLANEOUS) ×3 IMPLANT
SLING ARM IMMOBILIZER LRG (SOFTGOODS) ×2 IMPLANT
SLING ARM IMMOBILIZER MED (SOFTGOODS) IMPLANT
SPONGE GAUZE 4X4 12PLY STER LF (GAUZE/BANDAGES/DRESSINGS) IMPLANT
SPONGE LAP 18X18 X RAY DECT (DISPOSABLE) ×6 IMPLANT
STRIP CLOSURE SKIN 1/2X4 (GAUZE/BANDAGES/DRESSINGS) ×2 IMPLANT
SUCTION FRAZIER HANDLE 10FR (MISCELLANEOUS) ×2
SUCTION TUBE FRAZIER 10FR DISP (MISCELLANEOUS) ×1 IMPLANT
SUT FIBERWIRE #2 38 T-5 BLUE (SUTURE) ×15
SUT MNCRL AB 4-0 PS2 18 (SUTURE) IMPLANT
SUT VIC AB 0 CT1 27 (SUTURE) ×3
SUT VIC AB 0 CT1 27XBRD ANBCTR (SUTURE) ×1 IMPLANT
SUT VIC AB 2-0 CT1 27 (SUTURE) ×6
SUT VIC AB 2-0 CT1 TAPERPNT 27 (SUTURE) ×2 IMPLANT
SUT VIC AB 3-0 SH 27 (SUTURE)
SUT VIC AB 3-0 SH 27X BRD (SUTURE) IMPLANT
SUTURE FIBERWR #2 38 T-5 BLUE (SUTURE) ×5 IMPLANT
SYR 50ML LL SCALE MARK (SYRINGE) IMPLANT
SYR BULB IRRIGATION 50ML (SYRINGE) ×3 IMPLANT
TAPE PAPER 3X10 WHT MICROPORE (GAUZE/BANDAGES/DRESSINGS) ×3 IMPLANT
TOWEL OR 17X24 6PK STRL BLUE (TOWEL DISPOSABLE) ×5 IMPLANT
TOWEL OR NON WOVEN STRL DISP B (DISPOSABLE) ×4 IMPLANT
TUBE CONNECTING 20'X1/4 (TUBING)
TUBE CONNECTING 20X1/4 (TUBING) ×1 IMPLANT
YANKAUER SUCT BULB TIP NO VENT (SUCTIONS) ×3 IMPLANT

## 2016-11-27 NOTE — Progress Notes (Signed)
Assisted Dr. Jairo Benarswell Jackson with left, interscalene  block. Side rails up, monitors on throughout procedure. See vital signs in flow sheet. Tolerated Procedure well.

## 2016-11-27 NOTE — Interval H&P Note (Signed)
History and Physical Interval Note:  11/27/2016 7:30 AM  Laura Linatherine S Groene  has presented today for surgery, with the diagnosis of OA left shoulder  The various methods of treatment have been discussed with the patient and family. After consideration of risks, benefits and other options for treatment, the patient has consented to  Procedure(s): TOTAL SHOULDER ARTHROPLASTY (Left) as a surgical intervention .  The patient's history has been reviewed, patient examined, no change in status, stable for surgery.  I have reviewed the patient's chart and labs.  Questions were answered to the patient's satisfaction.     Loreta Aveaniel F Kyriaki Moder

## 2016-11-27 NOTE — Addendum Note (Signed)
Addendum  created 11/27/16 1841 by York Griceonna W Lexington Devine, CRNA   Charge Capture section accepted

## 2016-11-27 NOTE — Anesthesia Procedure Notes (Signed)
Anesthesia Regional Block:  Interscalene brachial plexus block  Pre-Anesthetic Checklist: ,, timeout performed, Correct Patient, Correct Site, Correct Laterality, Correct Procedure, Correct Position, site marked, Risks and benefits discussed,  Surgical consent,  Pre-op evaluation,  At surgeon's request and post-op pain management  Laterality: Left and Upper  Prep: chloraprep       Needles:  Injection technique: Single-shot  Needle Type: Stimulator Needle - 40     Needle Length: 4cm 4 cm Needle Gauge: 22 and 22 G    Additional Needles:  Procedures: nerve stimulator Interscalene brachial plexus block  Nerve Stimulator or Paresthesia:  Response: forearm twitch, 0.4 mA, 0.1 ms,   Additional Responses:   Narrative:  Start time: 11/27/2016 7:03 AM End time: 11/27/2016 7:10 AM Injection made incrementally with aspirations every 5 mL.  Performed by: Personally  Anesthesiologist: Jean RosenthalJACKSON, Edrian Melucci  Additional Notes: Pt identified in Holding room.  Monitors applied. Working IV access confirmed. Sterile prep L neck.  #22ga PNS to forearm twitch at 0.5440mA threshold.  30cc 0.5% Bupivacaine with 1:200k epi injected incrementally after negative test dose.  Patient asymptomatic, VSS, no heme aspirated, tolerated well.  Sandford Craze Shari Natt, MD

## 2016-11-27 NOTE — Anesthesia Preprocedure Evaluation (Signed)
Anesthesia Evaluation  Patient identified by MRN, date of birth, ID band Patient awake    Reviewed: Allergy & Precautions, NPO status , Patient's Chart, lab work & pertinent test results  History of Anesthesia Complications Negative for: history of anesthetic complications  Airway Mallampati: II  TM Distance: >3 FB Neck ROM: Full    Dental  (+) Dental Advisory Given   Pulmonary neg pulmonary ROS, former smoker,    breath sounds clear to auscultation       Cardiovascular (-) anginanegative cardio ROS   Rhythm:Regular Rate:Normal     Neuro/Psych negative neurological ROS     GI/Hepatic Neg liver ROS, GERD  Medicated and Controlled,  Endo/Other  negative endocrine ROS  Renal/GU negative Renal ROS     Musculoskeletal  (+) Arthritis ,   Abdominal (+) + obese,   Peds  Hematology negative hematology ROS (+)   Anesthesia Other Findings   Reproductive/Obstetrics                             Anesthesia Physical Anesthesia Plan  ASA: II  Anesthesia Plan: General   Post-op Pain Management: GA combined w/ Regional for post-op pain   Induction: Intravenous  Airway Management Planned: Oral ETT  Additional Equipment:   Intra-op Plan:   Post-operative Plan: Extubation in OR  Informed Consent: I have reviewed the patients History and Physical, chart, labs and discussed the procedure including the risks, benefits and alternatives for the proposed anesthesia with the patient or authorized representative who has indicated his/her understanding and acceptance.   Dental advisory given  Plan Discussed with: CRNA and Surgeon  Anesthesia Plan Comments: (Plan routine monitors, GETA with interscalene block for post op analgesia)        Anesthesia Quick Evaluation

## 2016-11-27 NOTE — Discharge Instructions (Signed)
°  Care After Instructions Refer to this sheet in the next few weeks. These discharge instructions provide you with general information on caring for yourself after you leave the hospital. Your caregiver may also give you specific instructions. Your treatment has been planned according to the most current medical practices available, but unavoidable complications sometimes occur. If you have any problems or questions after discharge, please call your caregiver. HOME INSTRUCTIONS You may resume a normal diet and activities as directed.  Wear sling at all times.  Do not remove bandage.  Do not get bandage wet. Only take over-the-counter or prescription medicines for pain, discomfort, or fever as directed by your caregiver.  Wear your sling for the next 6 weeks unless otherwise instructed. Eat a well-balanced diet.  Avoid lifting or driving until you are instructed otherwise.  Make an appointment to see your caregiver for stitches (suture) or staple removal one week after surgery.   SEEK MEDICAL CARE IF: You have swelling of your calf or leg.  You develop shortness of breath or chest pain.  You have redness, swelling, or increasing pain in the wound.  There is pus or any unusual drainage coming from the surgical site.  You notice a bad smell coming from the surgical site or dressing.  The surgical site breaks open after sutures or staples have been removed.  There is persistent bleeding from the suture or staple line.  You are getting worse or are not improving.  You have any other questions or concerns.  SEEK IMMEDIATE MEDICAL CARE IF:  You have a fever greater than 101 You develop a rash.  You have difficulty breathing.  You develop any reaction or side effects to medicines given.  Your knee motion is decreasing rather than improving.  MAKE SURE YOU:  Understand these instructions.  Will watch your condition.  Will get help right away if you are not doing well or get worse.    Regional  Anesthesia Blocks  1. Numbness or the inability to move the "blocked" extremity may last from 3-48 hours after placement. The length of time depends on the medication injected and your individual response to the medication. If the numbness is not going away after 48 hours, call your surgeon.  2. The extremity that is blocked will need to be protected until the numbness is gone and the  Strength has returned. Because you cannot feel it, you will need to take extra care to avoid injury. Because it may be weak, you may have difficulty moving it or using it. You may not know what position it is in without looking at it while the block is in effect.  3. For blocks in the legs and feet, returning to weight bearing and walking needs to be done carefully. You will need to wait until the numbness is entirely gone and the strength has returned. You should be able to move your leg and foot normally before you try and bear weight or walk. You will need someone to be with you when you first try to ensure you do not fall and possibly risk injury.  4. Bruising and tenderness at the needle site are common side effects and will resolve in a few days.  5. Persistent numbness or new problems with movement should be communicated to the surgeon or the [facility]  [phone])/ [facility] [phone]).

## 2016-11-27 NOTE — Anesthesia Postprocedure Evaluation (Signed)
Anesthesia Post Note  Patient: Laura LinCatherine S Cherian  Procedure(s) Performed: Procedure(s) (LRB): TOTAL SHOULDER ARTHROPLASTY (Left)  Patient location during evaluation: PACU Anesthesia Type: General and Regional Level of consciousness: awake and alert, oriented and patient cooperative Pain management: pain level controlled Vital Signs Assessment: post-procedure vital signs reviewed and stable Respiratory status: spontaneous breathing, nonlabored ventilation, respiratory function stable and patient connected to nasal cannula oxygen Cardiovascular status: blood pressure returned to baseline and stable Postop Assessment: no signs of nausea or vomiting Anesthetic complications: no       Last Vitals:  Vitals:   11/27/16 1115 11/27/16 1223  BP: 130/82 111/72  Pulse: 82 86  Resp: 18 18  Temp: 36.1 C 36.2 C    Last Pain:  Vitals:   11/27/16 1223  TempSrc:   PainSc: 3                  Karyna Bessler,E. Marrissa Dai

## 2016-11-27 NOTE — Anesthesia Procedure Notes (Signed)
Procedure Name: Intubation Performed by: Laura Page Pre-anesthesia Checklist: Patient identified, Emergency Drugs available, Suction available and Patient being monitored Patient Re-evaluated:Patient Re-evaluated prior to inductionOxygen Delivery Method: Circle system utilized Preoxygenation: Pre-oxygenation with 100% oxygen Intubation Type: IV induction Ventilation: Mask ventilation without difficulty Laryngoscope Size: Miller and 2 Grade View: Grade II Tube type: Oral Tube size: 7.0 mm Number of attempts: 1 Airway Equipment and Method: Stylet and Oral airway Placement Confirmation: ETT inserted through vocal cords under direct vision,  positive ETCO2 and breath sounds checked- equal and bilateral Secured at: 22 cm Tube secured with: Tape Dental Injury: Teeth and Oropharynx as per pre-operative assessment        

## 2016-11-27 NOTE — Transfer of Care (Signed)
Immediate Anesthesia Transfer of Care Note  Patient: Laura Page  Procedure(s) Performed: Procedure(s): TOTAL SHOULDER ARTHROPLASTY (Left)  Patient Location: PACU  Anesthesia Type:General  Level of Consciousness: awake and sedated  Airway & Oxygen Therapy: Patient Spontanous Breathing and Patient connected to face mask oxygen  Post-op Assessment: Report given to RN and Post -op Vital signs reviewed and stable  Post vital signs: Reviewed and stable  Last Vitals:  Vitals:   11/27/16 0709 11/27/16 0711  BP: (!) 147/79 138/80  Pulse: 77 72  Resp: 20 12  Temp:      Last Pain:  Vitals:   11/27/16 0647  TempSrc: Oral  PainSc: 2          Complications: No apparent anesthesia complications

## 2016-11-28 ENCOUNTER — Encounter (HOSPITAL_BASED_OUTPATIENT_CLINIC_OR_DEPARTMENT_OTHER): Payer: Self-pay | Admitting: Orthopedic Surgery

## 2016-11-28 DIAGNOSIS — M19012 Primary osteoarthritis, left shoulder: Secondary | ICD-10-CM | POA: Diagnosis not present

## 2016-11-28 NOTE — Op Note (Signed)
NAME:  Laura Page, Laura Page          ACCOUNT NO.:  [id number]  MEDICAL RECORD NO.:  [id number]  LOCATION:                                 FACILITY:  PHYSICIAN:  Loreta Aveaniel F. Cheral Cappucci, M.D. DATE OF BIRTH:  Jun 07, 1956  DATE OF PROCEDURE: DATE OF DISCHARGE:                              OPERATIVE REPORT   PREOPERATIVE DIAGNOSES:  End-stage arthritis, left shoulder, primary localized with intact rotator cuff.  POSTOPERATIVE DIAGNOSES:  End-stage arthritis, left shoulder, primary localized with intact rotator cuff with attrition and partial tearing, long head biceps tendon.  PROCEDURE:  Left total shoulder replacement utilizing Stryker prosthesis.  Release resection intra-articular portion of biceps tendon. A cemented pegged 44 mm glenoid component.  A press-fit #12 humeral stem with a 44 x 22 humeral head.  SURGEON:  Loreta Aveaniel F. Angeleigh Chiasson, M.D.  ASSISTANT:  Mikey KirschnerLindsey Stanberry, PA, present throughout the entire case and necessary for timely completion of procedure.  ANESTHESIA:  General.  ESTIMATED BLOOD LOSS:  Minimal.  SPECIMENS:  None.  CULTURES:  None.  COMPLICATIONS:  None.  DRESSINGS:  Soft compressive shoulder immobilizer.  DESCRIPTION OF PROCEDURE:  The patient was brought to the operating room and after adequate anesthesia had been obtained, shoulder examined. Less than 50% active and passive motion from marked erosive change. Prepped and draped in usual sterile fashion.  Beach-chair position and shoulder positioner.  Deltopectoral incision.  Retractors put in place. Conjoined tendon retracted.  Subscapularis taken down, retracted medially.  Marked grade 4 changes.  Humeral surgical neck cut at 30 degrees retroversion along the surgical neck.  All periarticular spurs removed.  Partial tearing, significant attrition, intra-articular portion of biceps released, resected, and allowed to recess the bicipital groove.  Marked spurring on inspection found and debrided. This was  somewhat compromised.  The remaining tissue to repair the subscapularis.  The glenoid exposed.  Measured, reamed, drilled, and then fitted with a pegged cemented 44 mm component.  Humerus exposed. Handheld reamers and broaches.  After appropriate sizing and trial fitted with a 12 mm stem, 44 x 22 head.  Nice solid stable reduction, anatomic alignment, full passive motion.  Retractors removed. Subscapularis was then repaired augmenting this with the bottom through drill holes in the humerus as well as over tying it laterally into the biceps tendon within the bicipital groove.  I was able to get a nice firm repair.  I did attempt to place a SwiveLock anchor, but I did not have enough hole with this because of the underlying prosthesis and that was removed.  I was pleased with repair completion.  Wound irrigated. Deltopectoral interval closed.  Subcutaneous and subcuticular closure. Sterile compressive dressing applied.  Shoulder immobilizer applied. Anesthesia reversed.  Brought to the recovery room.  Tolerated the surgery well.  No complications.     Loreta Aveaniel F. Dyon Rotert, M.D.   ______________________________ Loreta Aveaniel F. Maydell Knoebel, M.D.    DFM/MEDQ  D:  11/27/2016  T:  11/28/2016  Job:  [id number]

## 2017-02-03 ENCOUNTER — Emergency Department (HOSPITAL_COMMUNITY)
Admission: EM | Admit: 2017-02-03 | Discharge: 2017-02-03 | Disposition: A | Discharge status: Home / Self Care | Payer: 59 | Attending: Emergency Medicine | Admitting: Emergency Medicine

## 2017-02-03 ENCOUNTER — Emergency Department (HOSPITAL_COMMUNITY): Payer: 59

## 2017-02-03 ENCOUNTER — Encounter (HOSPITAL_COMMUNITY): Payer: Self-pay | Admitting: Emergency Medicine

## 2017-02-03 DIAGNOSIS — Z79899 Other long term (current) drug therapy: Secondary | ICD-10-CM | POA: Diagnosis not present

## 2017-02-03 DIAGNOSIS — Z96612 Presence of left artificial shoulder joint: Secondary | ICD-10-CM | POA: Insufficient documentation

## 2017-02-03 DIAGNOSIS — K8050 Calculus of bile duct without cholangitis or cholecystitis without obstruction: Secondary | ICD-10-CM

## 2017-02-03 DIAGNOSIS — Z87891 Personal history of nicotine dependence: Secondary | ICD-10-CM | POA: Insufficient documentation

## 2017-02-03 DIAGNOSIS — R1013 Epigastric pain: Secondary | ICD-10-CM | POA: Diagnosis present

## 2017-02-03 DIAGNOSIS — R109 Unspecified abdominal pain: Secondary | ICD-10-CM

## 2017-02-03 LAB — COMPREHENSIVE METABOLIC PANEL
ALBUMIN: 3.8 g/dL (ref 3.5–5.0)
ALT: 40 U/L (ref 14–54)
AST: 61 U/L — AB (ref 15–41)
Alkaline Phosphatase: 123 U/L (ref 38–126)
Anion gap: 7 (ref 5–15)
BUN: 21 mg/dL — AB (ref 6–20)
CO2: 24 mmol/L (ref 22–32)
Calcium: 9.4 mg/dL (ref 8.9–10.3)
Chloride: 109 mmol/L (ref 101–111)
Creatinine, Ser: 0.68 mg/dL (ref 0.44–1.00)
GFR calc Af Amer: >60 mL/min (ref >60)
GFR calc non Af Amer: >60 mL/min (ref >60)
Glucose, Bld: 105 mg/dL — ABNORMAL HIGH (ref 65–99)
POTASSIUM: 3.7 mmol/L (ref 3.5–5.1)
SODIUM: 140 mmol/L (ref 135–145)
TOTAL PROTEIN: 7.1 g/dL (ref 6.5–8.1)
Total Bilirubin: 0.6 mg/dL (ref 0.3–1.2)

## 2017-02-03 LAB — CBC
HEMATOCRIT: 40.4 % (ref 36.0–46.0)
HEMOGLOBIN: 13.9 g/dL (ref 12.0–15.0)
MCH: 29.6 pg (ref 26.0–34.0)
MCHC: 34.4 g/dL (ref 30.0–36.0)
MCV: 86 fL (ref 78.0–100.0)
Platelets: 243 10*3/uL (ref 150–400)
RBC: 4.7 MIL/uL (ref 3.87–5.11)
RDW: 12.5 % (ref 11.5–15.5)
WBC: 6.6 10*3/uL (ref 4.0–10.5)

## 2017-02-03 LAB — LIPASE, BLOOD: LIPASE: 35 U/L (ref 11–51)

## 2017-02-03 MED ORDER — SODIUM CHLORIDE 0.9 % IV BOLUS (SEPSIS)
500.0000 mL | Freq: Once | INTRAVENOUS | Status: AC
  Administered 2017-02-03: 500 mL via INTRAVENOUS

## 2017-02-03 MED ORDER — DICYCLOMINE HCL 20 MG PO TABS: 20.0000 mg | ORAL_TABLET | Freq: Two times a day (BID) | ORAL | 0 refills | Status: DC | PRN

## 2017-02-03 MED ORDER — DICYCLOMINE HCL 10 MG PO CAPS
10.0000 mg | ORAL_CAPSULE | Freq: Once | ORAL | Status: AC
  Administered 2017-02-03: 10 mg via ORAL
  Filled 2017-02-03: qty 1

## 2017-02-03 NOTE — ED Provider Notes (Signed)
[id number] DEPT Provider Note   CSN: [id number] Arrival date & time: 02/03/17  0600     History   Chief Complaint Chief Complaint  Patient presents with  . Abdominal Pain    HPI Laura Page is a 61 y.o. female.  The history is provided by the patient. No language interpreter was used.  Abdominal Pain      Laura Page is a 61 y.o. female who presents to the Emergency Department complaining of abdominal pain.  This morning of 521 she woke from sleep with sudden onset severe, epigastric pain that radiates to her back. She has associated nausea. She took Pepto-Bismol and immediately vomited. Her pain is improved and heart is only a 3 out of 10 on but she has persistent pain in her epigastrium. No chest pain, shortness of breath, fevers, diarrhea. She had similar symptoms 10 days ago that were milder nature and resolved without any intervention. She has a history of abdominal hysterectomy but no additional abdominal surgeries.  Past Medical History:  Diagnosis Date  . Arthritis    fingers  . Dental crown present   . GERD (gastroesophageal reflux disease)   . Osteoarthritis of left shoulder 11/2016    Patient Active Problem List   Diagnosis Date Noted  . 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 ABDOMINAL HYSTERECTOMY    . BILATERAL SALPINGOOPHORECTOMY  12/17/2009  . HYSTEROSCOPY W/ ENDOMETRIAL ABLATION  06/09/2008  . SUPRACERVICAL ABDOMINAL HYSTERECTOMY  12/17/2009  . TOTAL SHOULDER ARTHROPLASTY Left 11/27/2016   Procedure: TOTAL SHOULDER ARTHROPLASTY;  Surgeon: Loreta Ave, MD;  Location: [facility];  Service: Orthopedics;  Laterality: Left;    OB History    No data available       Home Medications    Prior to Admission medications   Medication Sig Start Date End Date Taking? Authorizing Provider  Calcium-Phosphorus-Vitamin D (CALCIUM/VITAMIN D3/ADULT GUMMY PO)  Take by mouth.    Historical Provider, MD  dicyclomine (BENTYL) 20 MG tablet Take 1 tablet (20 mg total) by mouth 2 (two) times daily as needed for spasms. 02/03/17   Tilden Fossa, MD  methocarbamol (ROBAXIN) 500 MG tablet Take 1 tablet (500 mg total) by mouth 4 (four) times daily. 11/27/16   Cristie Hem, PA-C  Multiple Vitamin (MULTIVITAMIN) tablet Take 1 tablet by mouth daily.    Historical Provider, MD  omeprazole (PRILOSEC) 20 MG capsule Take 20 mg by mouth daily.    Historical Provider, MD  ondansetron (ZOFRAN) 4 MG tablet Take 1 tablet (4 mg total) by mouth every 8 (eight) hours as needed for nausea or vomiting. 11/27/16   Cristie Hem, PA-C  oxyCODONE-acetaminophen (PERCOCET) 5-325 MG tablet Take 1-2 tablets by mouth every 4 (four) hours as needed for severe pain. 11/27/16   Cristie Hem, PA-C    Family History Family History  Problem Relation Age of Onset  . Cancer Mother   . Stroke Father     Social History Social History  Substance Use Topics  . Smoking status: Former Smoker    Quit date: 11/02/2006  . Smokeless tobacco: Never Used  . Alcohol use No     Allergies   Patient has no known allergies.   Review of Systems Review of Systems  Gastrointestinal: Positive for abdominal pain.  All other systems reviewed and are negative.    Physical Exam Updated Vital Signs BP (!) 141/82 (BP Location: Right Arm)  Pulse 81   Temp 97.6 F (36.4 C) (Oral)   Resp 16   Ht  (1.727 m)   Wt 230 lb (104.3 kg)   SpO2 100%   BMI 34.97 kg/m   Physical Exam  Constitutional: She is oriented to person, place, and time. She appears well-developed and well-nourished.  HENT:  Head: Normocephalic and atraumatic.  Cardiovascular: Normal rate and regular rhythm.   No murmur heard. Pulmonary/Chest: Effort normal and breath sounds normal. No respiratory distress.  Abdominal: Soft. There is no rebound and no guarding.  Moderate epigastric and right upper quadrant  tenderness negative Murphy   Musculoskeletal: She exhibits no edema or tenderness.  Neurological: She is alert and oriented to person, place, and time.  Skin: Skin is warm and dry.  Psychiatric: She has a normal mood and affect. Her behavior is normal.  Nursing note and vitals reviewed.    ED Treatments / Results  Labs (all labs ordered are listed, but only abnormal results are displayed) Labs Reviewed  COMPREHENSIVE METABOLIC PANEL - Abnormal; Notable for the following:       Result Value   Glucose, Bld 105 (*)    BUN 21 (*)    AST 61 (*)    All other components within normal limits  LIPASE, BLOOD  CBC    EKG  EKG Interpretation None       Radiology US Abdomen Limited Ruq  Result Date: 02/03/2017 CLINICAL DATA:  Abdominal pain. EXAM: US ABDOMEN LIMITED - RIGHT UPPER QUADRANT COMPARISON:  CT 12/07/2009 . FINDINGS: Gallbladder: Gallstones are present. Gallstones measure up to 1.5 cm. Gallbladder wall thickness 2.4 mm. No pericholecystic fluid collection . Common bile duct: Diameter: 2.7 mm Liver: No focal lesion identified. Within normal limits in parenchymal echogenicity. IMPRESSION: Gallstones. Otherwise normal exam. No evidence of cholecystitis or biliary distention. Electronically Signed   By: Maisie Fus  Register   On: 02/03/2017 07:47    Procedures Procedures (including critical care time)  Medications Ordered in ED Medications  dicyclomine (BENTYL) capsule 10 mg (not administered)  sodium chloride 0.9 % bolus 500 mL (500 mLs Intravenous New Bag/Given 02/03/17 7829)     Initial Impression / Assessment and Plan / ED Course  I have reviewed the triage vital signs and the nursing notes.  Pertinent labs & imaging results that were available during my care of the patient were reviewed by me and considered in my medical decision making (see chart for details).     Patient here for evaluation of epigastric and right upper quadrant abdominal pain. She had severe pain prior  to ED arrival that continued to improve during her ED stay. On repeat assessment she reports minimal pain and she has mild epigastric and right upper quadrant tenderness. Right upper quadrant ultrasound demonstrates cholelithiasis with no evidence of cholecystitis or biliary obstruction. Counseled pt on home care for biliary colic. Discussed avoiding fatty foods, Bentyl or her home Percocet if she has recurrent pain. Discuss return precautions if she develops fevers severe, uncontrolled pain, new worrisome symptoms. Discussed importance of surgery follow-up.  Final Clinical Impressions(s) / ED Diagnoses   Final diagnoses:  Abdominal pain  Biliary colic    New Prescriptions New Prescriptions   DICYCLOMINE (BENTYL) 20 MG TABLET    Take 1 tablet (20 mg total) by mouth 2 (two) times daily as needed for spasms.     Tilden Fossa, MD 02/03/17 0830

## 2017-02-03 NOTE — ED Triage Notes (Signed)
Pt states she is having pain in her epigastric pain that started suddenly at 0530 this morning and radiates into her back  Pt states she took some pepto bismol and immediaely vomited  Pt states she had pasta alfredo last night for supper that had sausage in it

## 2017-02-03 NOTE — ED Notes (Signed)
Pt able to tolerate PO fluids without emesis.  

## 2017-03-28 IMAGING — MR MR HEAD WO/W CM
8 of 10 series · 34 of 48 positions shown · IV contrast (multihance)
Comparison: None.

CLINICAL DATA: New onset vertigo with dizziness, nausea, and
frontal headaches for 1-2 weeks.

EXAM:
MRI HEAD WITHOUT AND WITH CONTRAST
TECHNIQUE: Multiplanar, multiecho pulse sequences of the brain and surrounding
structures were obtained without and with intravenous contrast.
CONTRAST:  20mL MULTIHANCE GADOBENATE DIMEGLUMINE 529 MG/ML IV SOLN

[Series 2: T1 · sagittal · 5.0mm · 0.45mm/px · 2 of 23 slices shown]
[im 1/23]
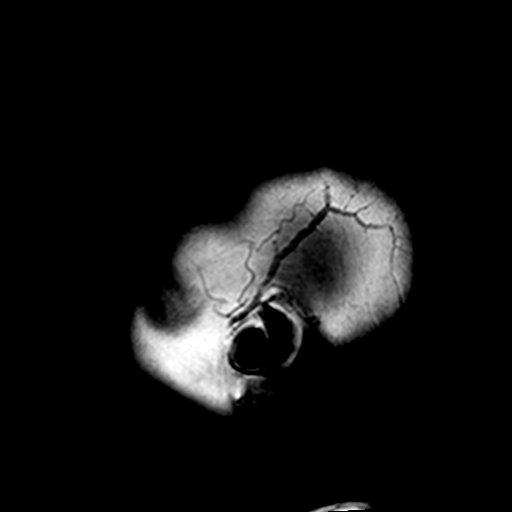
[im 23/23]
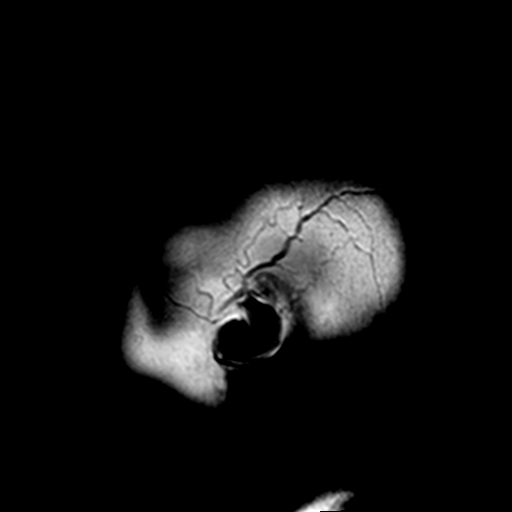

[Series 3: DWI · axial · 3.0mm · 2.19mm/px · z∈[-113,+55]mm · 11 of 104 slices shown (1 of 2)]
[im 1/104]
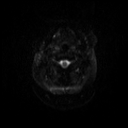
[im 11/104]
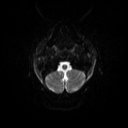
[im 21/104]
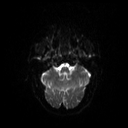
[im 31/104]
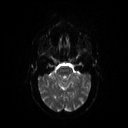
[im 42/104]
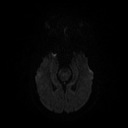
[im 52/104]
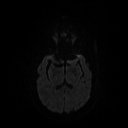
[im 62/104]
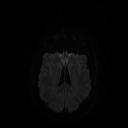
[im 73/104]
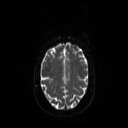
[im 83/104]
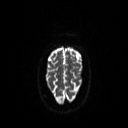
[im 93/104]
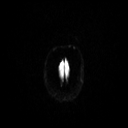
[im 104/104]
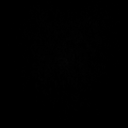

[Series 4: DWI · axial · 3.0mm · 2.19mm/px · z∈[-113,+52]mm · 6 of 51 slices shown (2 of 2)]
[im 1/51]
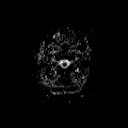
[im 11/51]
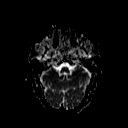
[im 21/51]
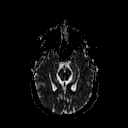
[im 31/51]
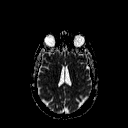
[im 41/51]
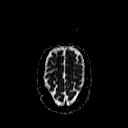
[im 51/51]
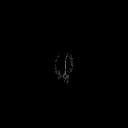

[Series 5: T2 · axial · 5.0mm · 0.45mm/px · z∈[-77,+92]mm · 3 of 26 slices shown (1 of 2)]
[im 1/26]
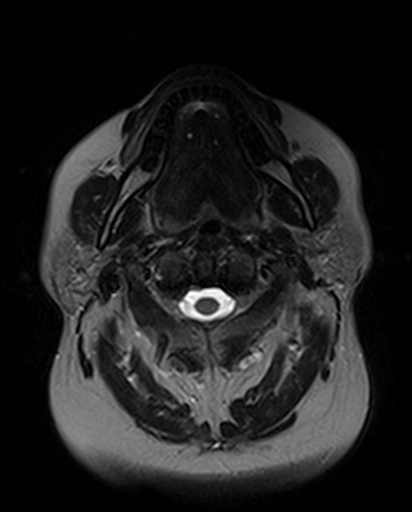
[im 13/26]
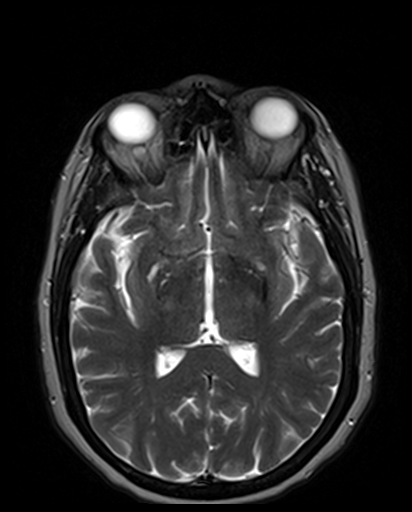
[im 26/26]
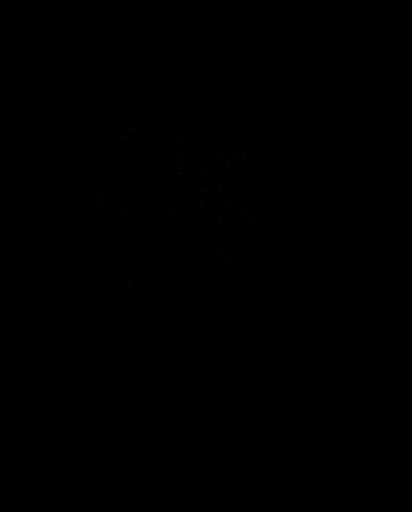

[Series 6: T2 · axial · 5.0mm · 0.45mm/px · z∈[-77,+78]mm · 3 of 24 slices shown (2 of 2)]
[im 1/24]
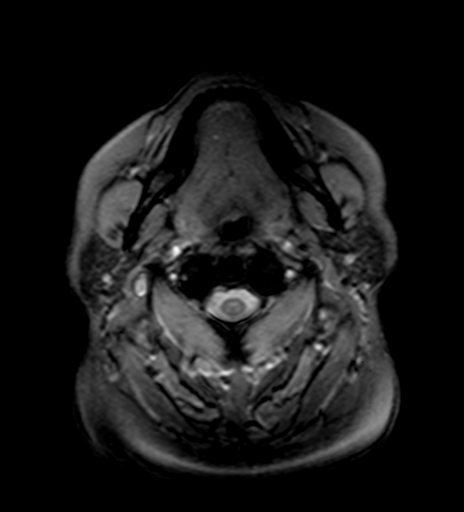
[im 12/24]
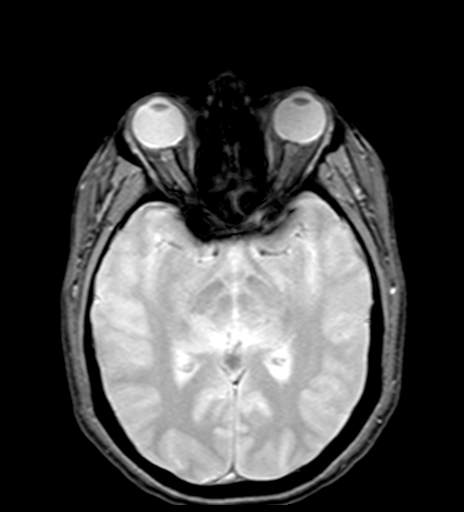
[im 24/24]
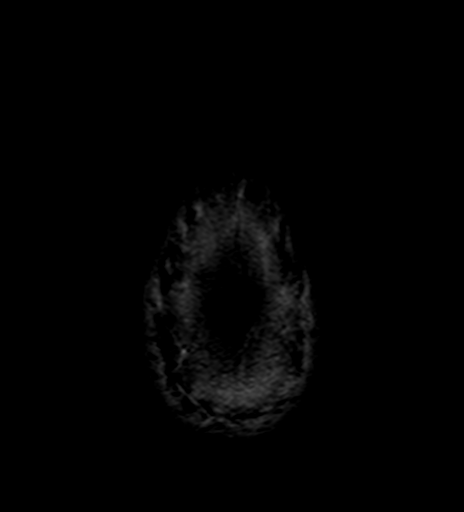

[Series 7: FLAIR · axial · 5.0mm · 0.45mm/px · z∈[-77,+85]mm · 3 of 25 slices shown]
[im 1/25]
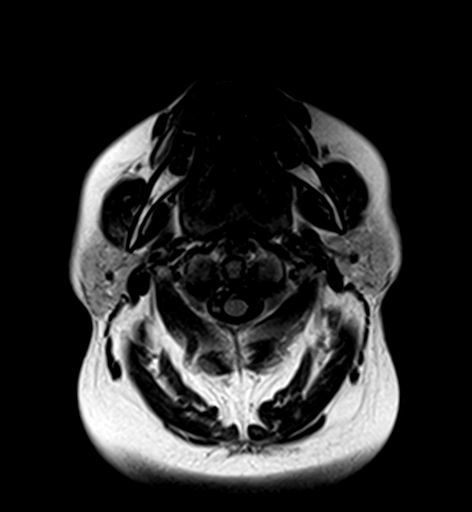
[im 13/25]
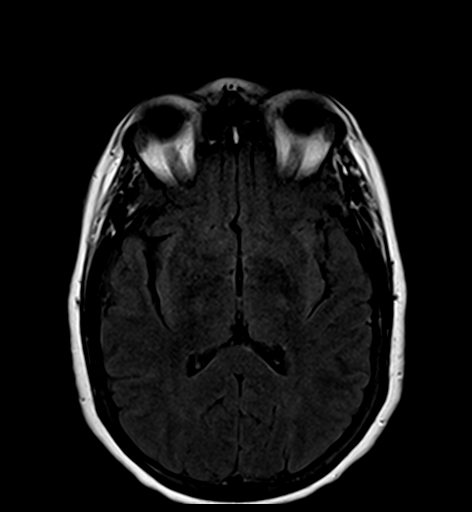
[im 25/25]
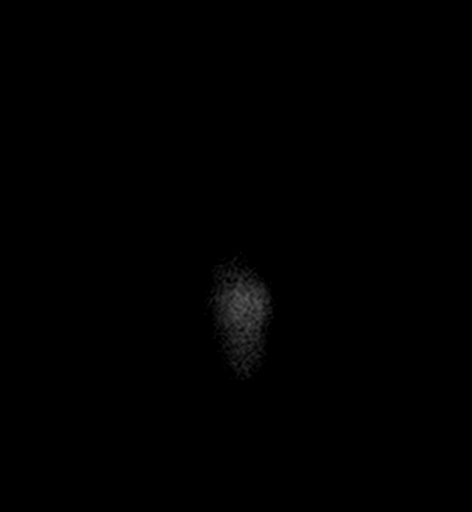

[Series 9: T2 post-contrast · coronal · 5.0mm · 0.45mm/px · 3 of 29 slices shown]
[im 1/29]
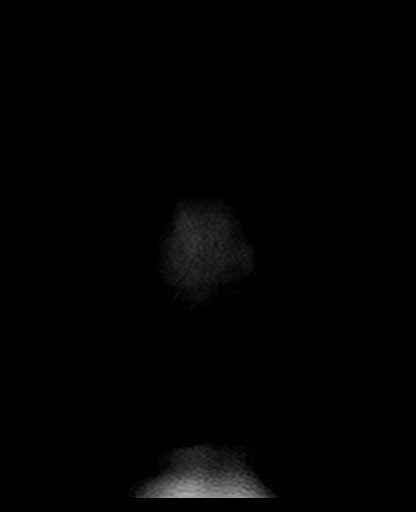
[im 15/29]
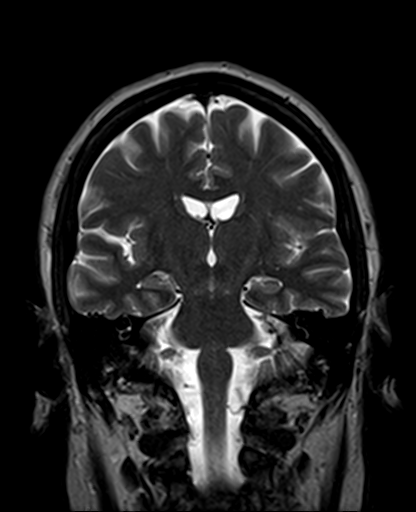
[im 29/29]
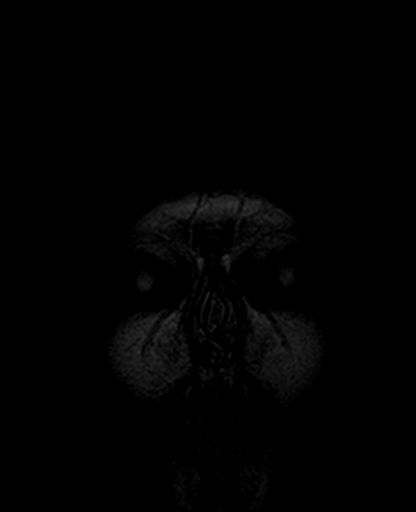

[Series 11: T1 post-contrast · coronal · 5.0mm · 0.45mm/px · 3 of 29 slices shown]
[im 1/29]
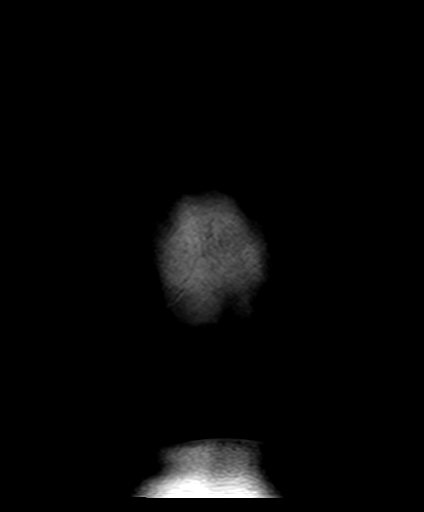
[im 15/29]
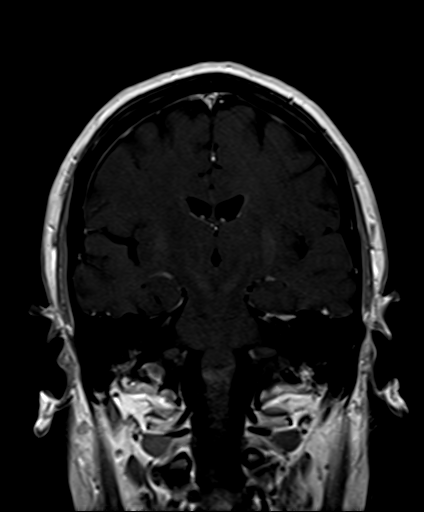
[im 29/29]
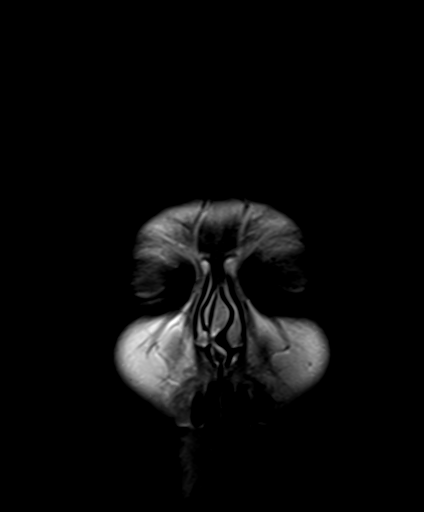

[34 of 48 positions shown; findings below may reference images not displayed]

FINDINGS: There is no evidence of acute infarct, intracranial hemorrhage,
mass, midline shift, or extra-axial fluid collection. Ventricles and
sulci are normal. Small foci of T2 hyperintensity are noted in the
subcortical and deep cerebral white matter bilaterally, greatest in
the frontal lobes and slightly advanced for age. No abnormal
enhancement is identified.

Orbits are unremarkable. There is opacification of an anterior left
ethmoid air cell. There are trace bilateral mastoid effusions. Major
intracranial vascular flow voids are preserved.
IMPRESSION: 1. No acute intracranial abnormality or mass.
2. Mild cerebral white matter disease, nonspecific. Considerations
include chronic small vessel ischemia, sequelae of trauma,
hypercoagulable state, vasculitis, migraines, prior infection or
less likely demyelination.

## 2019-06-10 ENCOUNTER — Other Ambulatory Visit: Payer: Self-pay

## 2019-06-10 DIAGNOSIS — Z20822 Contact with and (suspected) exposure to covid-19: Secondary | ICD-10-CM

## 2019-06-12 LAB — NOVEL CORONAVIRUS, NAA: SARS-CoV-2, NAA: NOT DETECTED

## 2019-10-18 ENCOUNTER — Other Ambulatory Visit: Payer: Self-pay | Admitting: Gynecology

## 2019-10-18 ENCOUNTER — Other Ambulatory Visit: Payer: Self-pay

## 2019-10-18 ENCOUNTER — Emergency Department (HOSPITAL_BASED_OUTPATIENT_CLINIC_OR_DEPARTMENT_OTHER): Payer: BC Managed Care – PPO

## 2019-10-18 ENCOUNTER — Encounter (HOSPITAL_BASED_OUTPATIENT_CLINIC_OR_DEPARTMENT_OTHER): Payer: Self-pay | Admitting: Emergency Medicine

## 2019-10-18 ENCOUNTER — Inpatient Hospital Stay (HOSPITAL_BASED_OUTPATIENT_CLINIC_OR_DEPARTMENT_OTHER)
Admission: EM | Admit: 2019-10-18 | Discharge: 2019-10-20 | DRG: 417 | Disposition: A | Discharge status: Home / Self Care | Payer: BC Managed Care – PPO | Attending: General Surgery | Admitting: General Surgery

## 2019-10-18 DIAGNOSIS — Z96612 Presence of left artificial shoulder joint: Secondary | ICD-10-CM | POA: Diagnosis present

## 2019-10-18 DIAGNOSIS — Z87891 Personal history of nicotine dependence: Secondary | ICD-10-CM | POA: Diagnosis not present

## 2019-10-18 DIAGNOSIS — Z6835 Body mass index (BMI) 35.0-35.9, adult: Secondary | ICD-10-CM | POA: Diagnosis not present

## 2019-10-18 DIAGNOSIS — K851 Biliary acute pancreatitis without necrosis or infection: Secondary | ICD-10-CM | POA: Diagnosis present

## 2019-10-18 DIAGNOSIS — Z823 Family history of stroke: Secondary | ICD-10-CM

## 2019-10-18 DIAGNOSIS — E669 Obesity, unspecified: Secondary | ICD-10-CM | POA: Diagnosis present

## 2019-10-18 DIAGNOSIS — K810 Acute cholecystitis: Secondary | ICD-10-CM | POA: Diagnosis present

## 2019-10-18 DIAGNOSIS — Z8719 Personal history of other diseases of the digestive system: Secondary | ICD-10-CM

## 2019-10-18 DIAGNOSIS — Z20828 Contact with and (suspected) exposure to other viral communicable diseases: Secondary | ICD-10-CM | POA: Diagnosis present

## 2019-10-18 DIAGNOSIS — Z419 Encounter for procedure for purposes other than remedying health state, unspecified: Secondary | ICD-10-CM

## 2019-10-18 DIAGNOSIS — K219 Gastro-esophageal reflux disease without esophagitis: Secondary | ICD-10-CM | POA: Diagnosis present

## 2019-10-18 DIAGNOSIS — Z79899 Other long term (current) drug therapy: Secondary | ICD-10-CM

## 2019-10-18 DIAGNOSIS — Z90710 Acquired absence of both cervix and uterus: Secondary | ICD-10-CM

## 2019-10-18 DIAGNOSIS — Z809 Family history of malignant neoplasm, unspecified: Secondary | ICD-10-CM | POA: Diagnosis not present

## 2019-10-18 DIAGNOSIS — K8000 Calculus of gallbladder with acute cholecystitis without obstruction: Principal | ICD-10-CM | POA: Diagnosis present

## 2019-10-18 DIAGNOSIS — K819 Cholecystitis, unspecified: Secondary | ICD-10-CM | POA: Diagnosis present

## 2019-10-18 DIAGNOSIS — M1990 Unspecified osteoarthritis, unspecified site: Secondary | ICD-10-CM | POA: Diagnosis present

## 2019-10-18 DIAGNOSIS — Z79891 Long term (current) use of opiate analgesic: Secondary | ICD-10-CM

## 2019-10-18 DIAGNOSIS — K859 Acute pancreatitis without necrosis or infection, unspecified: Secondary | ICD-10-CM | POA: Diagnosis not present

## 2019-10-18 DIAGNOSIS — N6310 Unspecified lump in the right breast, unspecified quadrant: Secondary | ICD-10-CM

## 2019-10-18 DIAGNOSIS — R1011 Right upper quadrant pain: Secondary | ICD-10-CM

## 2019-10-18 LAB — CBC
HCT: 44.5 % (ref 36.0–46.0)
Hemoglobin: 15 g/dL (ref 12.0–15.0)
MCH: 29.5 pg (ref 26.0–34.0)
MCHC: 33.7 g/dL (ref 30.0–36.0)
MCV: 87.6 fL (ref 80.0–100.0)
Platelets: 293 10*3/uL (ref 150–400)
RBC: 5.08 MIL/uL (ref 3.87–5.11)
RDW: 12.3 % (ref 11.5–15.5)
WBC: 9 10*3/uL (ref 4.0–10.5)
nRBC: 0 % (ref 0.0–0.2)

## 2019-10-18 LAB — HEPATIC FUNCTION PANEL
ALT: 61 U/L — ABNORMAL HIGH (ref 0–44)
AST: 90 U/L — ABNORMAL HIGH (ref 15–41)
Albumin: 4.1 g/dL (ref 3.5–5.0)
Alkaline Phosphatase: 143 U/L — ABNORMAL HIGH (ref 38–126)
Bilirubin, Direct: 0.4 mg/dL — ABNORMAL HIGH (ref 0.0–0.2)
Indirect Bilirubin: 0.8 mg/dL (ref 0.3–0.9)
Total Bilirubin: 1.2 mg/dL (ref 0.3–1.2)
Total Protein: 7.4 g/dL (ref 6.5–8.1)

## 2019-10-18 LAB — BASIC METABOLIC PANEL
Anion gap: 7 (ref 5–15)
BUN: 16 mg/dL (ref 8–23)
CO2: 27 mmol/L (ref 22–32)
Calcium: 9.6 mg/dL (ref 8.9–10.3)
Chloride: 106 mmol/L (ref 98–111)
Creatinine, Ser: 0.78 mg/dL (ref 0.44–1.00)
GFR calc Af Amer: >60 mL/min (ref >60)
GFR calc non Af Amer: >60 mL/min (ref >60)
Glucose, Bld: 106 mg/dL — ABNORMAL HIGH (ref 70–99)
Potassium: 4.4 mmol/L (ref 3.5–5.1)
Sodium: 140 mmol/L (ref 135–145)

## 2019-10-18 LAB — LIPASE, BLOOD: Lipase: 397 U/L — ABNORMAL HIGH (ref 11–51)

## 2019-10-18 LAB — TROPONIN I (HIGH SENSITIVITY): Troponin I (High Sensitivity): <2 ng/L (ref <18)

## 2019-10-18 LAB — SARS CORONAVIRUS 2 AG (30 MIN TAT): SARS Coronavirus 2 Ag: NEGATIVE

## 2019-10-18 MED ORDER — SODIUM CHLORIDE 0.9 % IV SOLN
2.0000 g | Freq: Once | INTRAVENOUS | Status: AC
  Administered 2019-10-18: 2 g via INTRAVENOUS
  Filled 2019-10-18: qty 20

## 2019-10-18 MED ORDER — OXYCODONE-ACETAMINOPHEN 5-325 MG PO TABS
2.0000 | ORAL_TABLET | Freq: Once | ORAL | Status: AC
  Administered 2019-10-18: 20:00:00 2 via ORAL
  Filled 2019-10-18: qty 2

## 2019-10-18 NOTE — ED Provider Notes (Signed)
[facility] DEPARTMENT Provider Note   CSN: [id number] Arrival date & time: 10/18/19  1726     History Chief Complaint  Patient presents with  . Chest Pain    Laura Page is a 63 y.o. female.  The history is provided by the patient. No language interpreter was used.  Abdominal Pain Pain location:  Epigastric and RUQ Pain quality: aching and cramping   Pain severity:  Moderate Onset quality:  Gradual Timing:  Constant Progression:  Worsening Chronicity:  New Context: not alcohol use   Relieved by:  Nothing Worsened by:  Nothing Associated symptoms: nausea   Associated symptoms: no diarrhea and no vomiting   Pt reports she has gallbladder disease.  Pt reports she was diagnosed with gallstones 2 over 2 years ago.  Pt reports she has had some attacks in the past.. Pt reports pain began today after eating.  Pt reports increased belching.    Pt sees [facility] providers in [place]   Past Medical History:  Diagnosis Date  . Arthritis    fingers  . Dental crown present   . GERD (gastroesophageal reflux disease)   . Osteoarthritis of left shoulder 11/2016    Patient Active Problem List   Diagnosis Date Noted  . 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 ABDOMINAL HYSTERECTOMY    . BILATERAL SALPINGOOPHORECTOMY  12/17/2009  . HYSTEROSCOPY W/ ENDOMETRIAL ABLATION  06/09/2008  . SUPRACERVICAL ABDOMINAL HYSTERECTOMY  12/17/2009  . TOTAL SHOULDER ARTHROPLASTY Left 11/27/2016   Procedure: TOTAL SHOULDER ARTHROPLASTY;  Surgeon: Ninetta Lights, MD;  Location: [facility];  Service: Orthopedics;  Laterality: Left;     OB History   No obstetric history on file.     Family History  Problem Relation Age of Onset  . Cancer Mother   . Stroke Father     Social History   Tobacco Use  . Smoking status: Former Smoker    Quit date: 11/02/2006    Years since quitting:  12.9  . Smokeless tobacco: Never Used  Substance Use Topics  . Alcohol use: No  . Drug use: No    Home Medications Prior to Admission medications   Medication Sig Start Date End Date Taking? Authorizing Provider  Calcium-Phosphorus-Vitamin D (CALCIUM/VITAMIN D3/ADULT GUMMY PO) Take by mouth.    [provider]  dicyclomine (BENTYL) 20 MG tablet Take 1 tablet (20 mg total) by mouth 2 (two) times daily as needed for spasms. 02/03/17   Quintella Reichert, MD  methocarbamol (ROBAXIN) 500 MG tablet Take 1 tablet (500 mg total) by mouth 4 (four) times daily. 11/27/16   Aundra Dubin, PA-C  Multiple Vitamin (MULTIVITAMIN) tablet Take 1 tablet by mouth daily.    [provider]  omeprazole (PRILOSEC) 20 MG capsule Take 20 mg by mouth daily.    [provider]  ondansetron (ZOFRAN) 4 MG tablet Take 1 tablet (4 mg total) by mouth every 8 (eight) hours as needed for nausea or vomiting. 11/27/16   Aundra Dubin, PA-C  oxyCODONE-acetaminophen (PERCOCET) 5-325 MG tablet Take 1-2 tablets by mouth every 4 (four) hours as needed for severe pain. 11/27/16   Aundra Dubin, PA-C    Allergies    Patient has no known allergies.  Review of Systems   Review of Systems  Gastrointestinal: Positive for abdominal pain and nausea. Negative for abdominal distention, diarrhea and vomiting.  All other systems reviewed and  are negative.   Physical Exam Updated Vital Signs BP (!) 145/82 (BP Location: Right Arm)   Pulse 74   Temp 98.8 F (37.1 C) (Oral)   Resp 17   Ht '5\' 8"'$  (1.727 m)   Wt 106.1 kg   SpO2 100%   BMI 35.58 kg/m   Physical Exam Vitals and nursing note reviewed.  Constitutional:      Appearance: She is well-developed.  HENT:     Head: Normocephalic.  Cardiovascular:     Rate and Rhythm: Normal rate.     Heart sounds: Normal heart sounds.  Pulmonary:     Effort: Pulmonary effort is normal.     Breath sounds: Normal breath sounds.  Abdominal:     General:  There is no distension.     Palpations: Abdomen is soft.     Tenderness: There is abdominal tenderness. There is guarding.  Musculoskeletal:        General: Normal range of motion.     Cervical back: Normal range of motion.  Skin:    General: Skin is warm.  Neurological:     General: No focal deficit present.     Mental Status: She is alert and oriented to person, place, and time.     ED Results / Procedures / Treatments   Labs (all labs ordered are listed, but only abnormal results are displayed) Labs Reviewed  BASIC METABOLIC PANEL - Abnormal; Notable for the following components:      Result Value   Glucose, Bld 106 (*)    All other components within normal limits  HEPATIC FUNCTION PANEL - Abnormal; Notable for the following components:   AST 90 (*)    ALT 61 (*)    Alkaline Phosphatase 143 (*)    Bilirubin, Direct 0.4 (*)    All other components within normal limits  LIPASE, BLOOD - Abnormal; Notable for the following components:   Lipase 397 (*)    All other components within normal limits  CBC  TROPONIN I (HIGH SENSITIVITY)  TROPONIN I (HIGH SENSITIVITY)    EKG EKG Interpretation  Date/Time:  Tuesday October 18 2019 17:32:46 EST Ventricular Rate:  79 PR Interval:  128 QRS Duration: 82 QT Interval:  384 QTC Calculation: 440 R Axis:   89 Text Interpretation: Normal sinus rhythm Normal ECG Confirmed by Fredia Sorrow [phone]) on 10/18/2019 5:38:43 PM   Radiology DG Chest 2 View  Result Date: 10/18/2019 CLINICAL DATA:  Chest pain, epigastric pain with shortness of breath for 3 hours EXAM: CHEST - 2 VIEW COMPARISON:  None. FINDINGS: No consolidation, features of edema, pneumothorax, or effusion. Pulmonary vascularity is normally distributed. The cardiomediastinal contours are unremarkable. No acute osseous or soft tissue abnormality. Left shoulder hemiarthroplasty in expected alignment. Degenerative changes are present in the imaged spine and shoulders.  IMPRESSION: No acute cardiopulmonary abnormality. Electronically Signed   By: Lovena Le M.D.   On: 10/18/2019 18:20   US Abdomen Limited RUQ  Result Date: 10/18/2019 CLINICAL DATA:  Epigastric pain EXAM: ULTRASOUND ABDOMEN LIMITED RIGHT UPPER QUADRANT COMPARISON:  Ultrasound abdomen limited dated 02/03/2017. FINDINGS: Gallbladder: The gallbladder wall is borderline thickened measuring approximately 3 mm. Gallstones are noted. Gallbladder sludge is noted. The sonographic Percell [place] sign is reported as positive. There is no definite pericholecystic free fluid. Common bile duct: Diameter: 4 mm Liver: Diffuse increased echogenicity with slightly heterogeneous liver. Appearance typically secondary to fatty infiltration. Fibrosis secondary consideration. No secondary findings of cirrhosis noted. No focal hepatic lesion or intrahepatic  biliary duct dilatation. Portal vein is patent on color Doppler imaging with normal direction of blood flow towards the liver. Other: None. IMPRESSION: Overall findings consistent with early acute calculus cholecystitis in the appropriate clinical setting Electronically Signed   By: Constance Holster M.D.   On: 10/18/2019 18:46    Procedures Procedures (including critical care time)  Medications Ordered in ED Medications  oxyCODONE-acetaminophen (PERCOCET/ROXICET) 5-325 MG per tablet 2 tablet (2 tablets Oral Given 10/18/19 1956)    ED Course  I have reviewed the triage vital signs and the nursing notes.  Pertinent labs & imaging results that were available during my care of the patient were reviewed by me and considered in my medical decision making (see chart for details).    MDM Rules/Calculators/A&P                      MDM  Pt's lipase is elevated to 397 hepatic functions are ast 90 alt, 61 and alk phos is 143.  Pt has a normal wbc count.  I spoke to [place] .  He advised he will consult if needed.  He advised surgical consult and admission.  He advised  possible MRI/MRCP  I spoke to Dr. Kallie Locks Surgeon who will see.  He request hospitalist see for admission.  [facility] Dr. Delford Field will see for admission Final Clinical Impression(s) / ED Diagnoses Final diagnoses:  RUQ abdominal pain  History of gallbladder disease    Rx / DC Orders ED Discharge Orders    None       Sidney Ace 10/18/19 2136    Fredia Sorrow, MD 10/22/19 [phone]

## 2019-10-18 NOTE — ED Triage Notes (Signed)
Epigastric pain with SOB x 3 hours.

## 2019-10-18 NOTE — Progress Notes (Signed)
Called by Alyse Low, PA-C at [facility] because pt is known to our practice from egd/colonoscopy done 4 yrs ago.  Pt is presenting with what appears to be mild GS pancreatitis, with abd pn today, documented GS on u/s (23mm CBD), and mild elev of LFT's and lipase of 397.  Pt needs surgical consultation, and I would imagine that the surgeon would direct both the diagnostic and the therapeutic aspects of the pt's management.  We would be happy to see pt, if requested by either consulting surgeon or attending hospitalist, if they feel that input from [place] would be helpful.  Otherwise, will plan to remain on standby.  Cleotis Nipper, M.D. Pager [phone] If no answer or after 5 PM call [phone]

## 2019-10-19 ENCOUNTER — Inpatient Hospital Stay (HOSPITAL_COMMUNITY): Payer: BC Managed Care – PPO | Admitting: Certified Registered Nurse Anesthetist

## 2019-10-19 ENCOUNTER — Encounter (HOSPITAL_COMMUNITY): Payer: Self-pay | Admitting: Internal Medicine

## 2019-10-19 ENCOUNTER — Inpatient Hospital Stay (HOSPITAL_COMMUNITY): Payer: BC Managed Care – PPO

## 2019-10-19 ENCOUNTER — Encounter (HOSPITAL_COMMUNITY): Admission: EM | Disposition: A | Discharge status: Home / Self Care | Payer: Self-pay | Source: Home / Self Care

## 2019-10-19 DIAGNOSIS — K810 Acute cholecystitis: Secondary | ICD-10-CM

## 2019-10-19 DIAGNOSIS — K819 Cholecystitis, unspecified: Secondary | ICD-10-CM | POA: Diagnosis present

## 2019-10-19 DIAGNOSIS — K859 Acute pancreatitis without necrosis or infection, unspecified: Secondary | ICD-10-CM

## 2019-10-19 HISTORY — PX: CHOLECYSTECTOMY: SHX55

## 2019-10-19 LAB — CBC WITH DIFFERENTIAL/PLATELET
Abs Immature Granulocytes: 0.01 10*3/uL (ref 0.00–0.07)
Basophils Absolute: 0 10*3/uL (ref 0.0–0.1)
Basophils Relative: 1 %
Eosinophils Absolute: 0.1 10*3/uL (ref 0.0–0.5)
Eosinophils Relative: 2 %
HCT: 40.8 % (ref 36.0–46.0)
Hemoglobin: 13.2 g/dL (ref 12.0–15.0)
Immature Granulocytes: 0 %
Lymphocytes Relative: 32 %
Lymphs Abs: 1.4 10*3/uL (ref 0.7–4.0)
MCH: 29 pg (ref 26.0–34.0)
MCHC: 32.4 g/dL (ref 30.0–36.0)
MCV: 89.7 fL (ref 80.0–100.0)
Monocytes Absolute: 0.4 10*3/uL (ref 0.1–1.0)
Monocytes Relative: 9 %
Neutro Abs: 2.4 10*3/uL (ref 1.7–7.7)
Neutrophils Relative %: 56 %
Platelets: 200 10*3/uL (ref 150–400)
RBC: 4.55 MIL/uL (ref 3.87–5.11)
RDW: 12.6 % (ref 11.5–15.5)
WBC: 4.3 10*3/uL (ref 4.0–10.5)
nRBC: 0 % (ref 0.0–0.2)

## 2019-10-19 LAB — BASIC METABOLIC PANEL
Anion gap: 8 (ref 5–15)
BUN: 14 mg/dL (ref 8–23)
CO2: 27 mmol/L (ref 22–32)
Calcium: 9 mg/dL (ref 8.9–10.3)
Chloride: 106 mmol/L (ref 98–111)
Creatinine, Ser: 0.76 mg/dL (ref 0.44–1.00)
GFR calc Af Amer: >60 mL/min (ref >60)
GFR calc non Af Amer: >60 mL/min (ref >60)
Glucose, Bld: 107 mg/dL — ABNORMAL HIGH (ref 70–99)
Potassium: 4 mmol/L (ref 3.5–5.1)
Sodium: 141 mmol/L (ref 135–145)

## 2019-10-19 LAB — SARS CORONAVIRUS 2 (TAT 6-24 HRS): SARS Coronavirus 2: NEGATIVE

## 2019-10-19 LAB — HEPATIC FUNCTION PANEL
ALT: 182 U/L — ABNORMAL HIGH (ref 0–44)
AST: 214 U/L — ABNORMAL HIGH (ref 15–41)
Albumin: 3.5 g/dL (ref 3.5–5.0)
Alkaline Phosphatase: 158 U/L — ABNORMAL HIGH (ref 38–126)
Bilirubin, Direct: 0.6 mg/dL — ABNORMAL HIGH (ref 0.0–0.2)
Indirect Bilirubin: 0.1 mg/dL — ABNORMAL LOW (ref 0.3–0.9)
Total Bilirubin: 0.7 mg/dL (ref 0.3–1.2)
Total Protein: 6.5 g/dL (ref 6.5–8.1)

## 2019-10-19 LAB — HIV ANTIBODY (ROUTINE TESTING W REFLEX): HIV Screen 4th Generation wRfx: NONREACTIVE

## 2019-10-19 LAB — GLUCOSE, CAPILLARY: Glucose-Capillary: 98 mg/dL (ref 70–99)

## 2019-10-19 LAB — LIPASE, BLOOD: Lipase: 44 U/L (ref 11–51)

## 2019-10-19 SURGERY — LAPAROSCOPIC CHOLECYSTECTOMY WITH INTRAOPERATIVE CHOLANGIOGRAM
Anesthesia: General | Site: Abdomen

## 2019-10-19 MED ORDER — ONDANSETRON HCL 4 MG/2ML IJ SOLN
INTRAMUSCULAR | Status: DC | PRN
  Administered 2019-10-19: 4 mg via INTRAVENOUS

## 2019-10-19 MED ORDER — DEXMEDETOMIDINE HCL IN NACL 200 MCG/50ML IV SOLN
INTRAVENOUS | Status: AC
  Filled 2019-10-19: qty 50

## 2019-10-19 MED ORDER — ONDANSETRON 4 MG PO TBDP: 4.0000 mg | ORAL_TABLET | Freq: Four times a day (QID) | ORAL | Status: DC | PRN

## 2019-10-19 MED ORDER — KETOROLAC TROMETHAMINE 15 MG/ML IJ SOLN
INTRAMUSCULAR | Status: DC | PRN
  Administered 2019-10-19: 15 mg via INTRAVENOUS

## 2019-10-19 MED ORDER — MORPHINE SULFATE (PF) 2 MG/ML IV SOLN: 1.0000 mg | INTRAVENOUS | Status: DC | PRN

## 2019-10-19 MED ORDER — ONDANSETRON HCL 4 MG/2ML IJ SOLN: 4.0000 mg | Freq: Once | INTRAMUSCULAR | Status: AC

## 2019-10-19 MED ORDER — 0.9 % SODIUM CHLORIDE (POUR BTL) OPTIME
TOPICAL | Status: DC | PRN
  Administered 2019-10-19: 1000 mL

## 2019-10-19 MED ORDER — FENTANYL CITRATE (PF) 250 MCG/5ML IJ SOLN
INTRAMUSCULAR | Status: AC
  Filled 2019-10-19: qty 5

## 2019-10-19 MED ORDER — FENTANYL CITRATE (PF) 100 MCG/2ML IJ SOLN
INTRAMUSCULAR | Status: AC
  Filled 2019-10-19: qty 2

## 2019-10-19 MED ORDER — ENOXAPARIN SODIUM 40 MG/0.4ML ~~LOC~~ SOLN
40.0000 mg | SUBCUTANEOUS | Status: DC
  Administered 2019-10-20: 40 mg via SUBCUTANEOUS
  Filled 2019-10-19: qty 0.4

## 2019-10-19 MED ORDER — PANTOPRAZOLE SODIUM 40 MG IV SOLR
40.0000 mg | Freq: Every day | INTRAVENOUS | Status: DC
  Administered 2019-10-19: 40 mg via INTRAVENOUS
  Filled 2019-10-19: qty 40

## 2019-10-19 MED ORDER — ONDANSETRON HCL 4 MG/2ML IJ SOLN: 4.0000 mg | Freq: Four times a day (QID) | INTRAMUSCULAR | Status: DC | PRN

## 2019-10-19 MED ORDER — SUGAMMADEX SODIUM 500 MG/5ML IV SOLN
INTRAVENOUS | Status: AC
  Filled 2019-10-19: qty 5

## 2019-10-19 MED ORDER — FENTANYL CITRATE (PF) 100 MCG/2ML IJ SOLN: INTRAMUSCULAR | Status: DC | PRN

## 2019-10-19 MED ORDER — ONDANSETRON HCL 4 MG/2ML IJ SOLN
INTRAMUSCULAR | Status: AC
  Filled 2019-10-19: qty 2

## 2019-10-19 MED ORDER — MIDAZOLAM HCL 2 MG/2ML IJ SOLN
INTRAMUSCULAR | Status: AC
  Filled 2019-10-19: qty 2

## 2019-10-19 MED ORDER — SCOPOLAMINE 1 MG/3DAYS TD PT72
MEDICATED_PATCH | TRANSDERMAL | Status: AC
  Filled 2019-10-19: qty 1

## 2019-10-19 MED ORDER — ONDANSETRON HCL 4 MG/2ML IJ SOLN
INTRAMUSCULAR | Status: AC
  Administered 2019-10-19: 4 mg via INTRAVENOUS
  Filled 2019-10-19: qty 2

## 2019-10-19 MED ORDER — LACTATED RINGERS IV SOLN: INTRAVENOUS | Status: DC

## 2019-10-19 MED ORDER — LIDOCAINE 2% (20 MG/ML) 5 ML SYRINGE
INTRAMUSCULAR | Status: DC | PRN
  Administered 2019-10-19: 80 mg via INTRAVENOUS

## 2019-10-19 MED ORDER — GABAPENTIN 100 MG PO CAPS
200.0000 mg | ORAL_CAPSULE | Freq: Two times a day (BID) | ORAL | Status: DC
  Administered 2019-10-19: 200 mg via ORAL
  Filled 2019-10-19: qty 2

## 2019-10-19 MED ORDER — OXYCODONE HCL 5 MG PO TABS: 5.0000 mg | ORAL_TABLET | ORAL | Status: DC | PRN

## 2019-10-19 MED ORDER — ROCURONIUM BROMIDE 10 MG/ML (PF) SYRINGE
PREFILLED_SYRINGE | INTRAVENOUS | Status: AC
  Filled 2019-10-19: qty 10

## 2019-10-19 MED ORDER — ROCURONIUM BROMIDE 50 MG/5ML IV SOSY
PREFILLED_SYRINGE | INTRAVENOUS | Status: DC | PRN
  Administered 2019-10-19 (×2): 10 mg via INTRAVENOUS
  Administered 2019-10-19: 40 mg via INTRAVENOUS
  Administered 2019-10-19: 10 mg via INTRAVENOUS

## 2019-10-19 MED ORDER — ACETAMINOPHEN 500 MG PO TABS
1000.0000 mg | ORAL_TABLET | Freq: Four times a day (QID) | ORAL | Status: DC
  Administered 2019-10-19 – 2019-10-20 (×3): 1000 mg via ORAL
  Filled 2019-10-19 (×3): qty 2

## 2019-10-19 MED ORDER — SODIUM CHLORIDE 0.9 % IV SOLN
2.0000 g | INTRAVENOUS | Status: DC
  Filled 2019-10-19: qty 20

## 2019-10-19 MED ORDER — BUPIVACAINE HCL (PF) 0.5 % IJ SOLN
INTRAMUSCULAR | Status: AC
  Filled 2019-10-19: qty 30

## 2019-10-19 MED ORDER — MIDAZOLAM HCL 5 MG/5ML IJ SOLN
INTRAMUSCULAR | Status: DC | PRN
  Administered 2019-10-19: 2 mg via INTRAVENOUS

## 2019-10-19 MED ORDER — PROPOFOL 10 MG/ML IV BOLUS
INTRAVENOUS | Status: DC | PRN
  Administered 2019-10-19: 150 mg via INTRAVENOUS

## 2019-10-19 MED ORDER — DEXAMETHASONE SODIUM PHOSPHATE 10 MG/ML IJ SOLN
INTRAMUSCULAR | Status: DC | PRN
  Administered 2019-10-19: 10 mg via INTRAVENOUS

## 2019-10-19 MED ORDER — ONDANSETRON HCL 4 MG PO TABS: 4.0000 mg | ORAL_TABLET | Freq: Four times a day (QID) | ORAL | Status: DC | PRN

## 2019-10-19 MED ORDER — SCOPOLAMINE 1 MG/3DAYS TD PT72
MEDICATED_PATCH | TRANSDERMAL | Status: DC | PRN
  Administered 2019-10-19: 1 via TRANSDERMAL

## 2019-10-19 MED ORDER — SUCCINYLCHOLINE CHLORIDE 200 MG/10ML IV SOSY
PREFILLED_SYRINGE | INTRAVENOUS | Status: DC | PRN
  Administered 2019-10-19: 100 mg via INTRAVENOUS

## 2019-10-19 MED ORDER — LACTATED RINGERS IR SOLN
Status: DC | PRN
  Administered 2019-10-19: 1000 mL

## 2019-10-19 MED ORDER — DEXAMETHASONE SODIUM PHOSPHATE 10 MG/ML IJ SOLN
INTRAMUSCULAR | Status: AC
  Filled 2019-10-19: qty 1

## 2019-10-19 MED ORDER — DEXMEDETOMIDINE HCL 200 MCG/2ML IV SOLN
INTRAVENOUS | Status: DC | PRN
  Administered 2019-10-19: 8 ug via INTRAVENOUS

## 2019-10-19 MED ORDER — LIDOCAINE 2% (20 MG/ML) 5 ML SYRINGE
INTRAMUSCULAR | Status: AC
  Filled 2019-10-19: qty 5

## 2019-10-19 MED ORDER — PIPERACILLIN-TAZOBACTAM 3.375 G IVPB
3.3750 g | Freq: Three times a day (TID) | INTRAVENOUS | Status: DC
  Administered 2019-10-19: 3.375 g via INTRAVENOUS
  Filled 2019-10-19: qty 50

## 2019-10-19 MED ORDER — DEXTROSE-NACL 5-0.9 % IV SOLN: INTRAVENOUS | Status: AC

## 2019-10-19 MED ORDER — ONDANSETRON HCL 4 MG/2ML IJ SOLN: 4.0000 mg | Freq: Once | INTRAMUSCULAR | Status: DC | PRN

## 2019-10-19 MED ORDER — DIPHENHYDRAMINE HCL 12.5 MG/5ML PO ELIX: 12.5000 mg | ORAL_SOLUTION | Freq: Four times a day (QID) | ORAL | Status: DC | PRN

## 2019-10-19 MED ORDER — BUPIVACAINE HCL (PF) 0.5 % IJ SOLN
INTRAMUSCULAR | Status: DC | PRN
  Administered 2019-10-19: 30 mL

## 2019-10-19 MED ORDER — FENTANYL CITRATE (PF) 100 MCG/2ML IJ SOLN
25.0000 ug | INTRAMUSCULAR | Status: DC | PRN
  Administered 2019-10-19 (×2): 50 ug via INTRAVENOUS

## 2019-10-19 MED ORDER — ACETAMINOPHEN 325 MG PO TABS: 650.0000 mg | ORAL_TABLET | Freq: Four times a day (QID) | ORAL | Status: DC | PRN

## 2019-10-19 MED ORDER — SIMETHICONE 80 MG PO CHEW: 40.0000 mg | CHEWABLE_TABLET | Freq: Four times a day (QID) | ORAL | Status: DC | PRN

## 2019-10-19 MED ORDER — DIPHENHYDRAMINE HCL 50 MG/ML IJ SOLN: 12.5000 mg | Freq: Four times a day (QID) | INTRAMUSCULAR | Status: DC | PRN

## 2019-10-19 MED ORDER — ACETAMINOPHEN 650 MG RE SUPP: 650.0000 mg | Freq: Four times a day (QID) | RECTAL | Status: DC | PRN

## 2019-10-19 MED ORDER — PROPOFOL 10 MG/ML IV BOLUS
INTRAVENOUS | Status: AC
  Filled 2019-10-19: qty 20

## 2019-10-19 MED ORDER — FENTANYL CITRATE (PF) 250 MCG/5ML IJ SOLN
INTRAMUSCULAR | Status: DC | PRN
  Administered 2019-10-19 (×6): 50 ug via INTRAVENOUS

## 2019-10-19 MED ORDER — SODIUM CHLORIDE 0.9 % IV SOLN
INTRAVENOUS | Status: DC | PRN
  Administered 2019-10-19: 16 mL

## 2019-10-19 MED ORDER — SUGAMMADEX SODIUM 500 MG/5ML IV SOLN
INTRAVENOUS | Status: DC | PRN
  Administered 2019-10-19: 400 mg via INTRAVENOUS

## 2019-10-19 SURGICAL SUPPLY — 58 items
ADH SKN CLS APL DERMABOND .7 (GAUZE/BANDAGES/DRESSINGS) ×1
APL PRP STRL LF DISP 70% ISPRP (MISCELLANEOUS) ×1
APL SKNCLS STERI-STRIP NONHPOA (GAUZE/BANDAGES/DRESSINGS)
APL SRG 38 LTWT LNG FL B (MISCELLANEOUS)
APPLICATOR ARISTA FLEXITIP XL (MISCELLANEOUS) IMPLANT
APPLIER CLIP 5 13 M/L LIGAMAX5 (MISCELLANEOUS)
APPLIER CLIP ROT 10 11.4 M/L (STAPLE)
APR CLP MED LRG 11.4X10 (STAPLE)
APR CLP MED LRG 5 ANG JAW (MISCELLANEOUS)
BAG SPEC RTRVL 10 TROC 200 (ENDOMECHANICALS) ×1
BENZOIN TINCTURE PRP APPL 2/3 (GAUZE/BANDAGES/DRESSINGS) IMPLANT
BNDG ADH 1X3 SHEER STRL LF (GAUZE/BANDAGES/DRESSINGS) ×4 IMPLANT
BNDG ADH THN 3X1 STRL LF (GAUZE/BANDAGES/DRESSINGS)
CABLE HIGH FREQUENCY MONO STRZ (ELECTRODE) ×3 IMPLANT
CHLORAPREP W/TINT 26 (MISCELLANEOUS) ×3 IMPLANT
CLIP APPLIE 5 13 M/L LIGAMAX5 (MISCELLANEOUS) IMPLANT
CLIP APPLIE ROT 10 11.4 M/L (STAPLE) IMPLANT
CLIP VESOLOCK MED LG 6/CT (CLIP) IMPLANT
CLOSURE WOUND 1/2 X4 (GAUZE/BANDAGES/DRESSINGS)
COVER MAYO STAND STRL (DRAPES) ×2 IMPLANT
COVER SURGICAL LIGHT HANDLE (MISCELLANEOUS) ×3 IMPLANT
COVER WAND RF STERILE (DRAPES) IMPLANT
DECANTER SPIKE VIAL GLASS SM (MISCELLANEOUS) ×3 IMPLANT
DERMABOND ADVANCED (GAUZE/BANDAGES/DRESSINGS) ×2
DERMABOND ADVANCED .7 DNX12 (GAUZE/BANDAGES/DRESSINGS) IMPLANT
DRAPE C-ARM 42X120 X-RAY (DRAPES) ×2 IMPLANT
DRSG TEGADERM 2-3/8X2-3/4 SM (GAUZE/BANDAGES/DRESSINGS) IMPLANT
ELECT REM PT RETURN 15FT ADLT (MISCELLANEOUS) ×3 IMPLANT
GAUZE SPONGE 2X2 8PLY STRL LF (GAUZE/BANDAGES/DRESSINGS) IMPLANT
GLOVE BIO SURGEON STRL SZ7.5 (GLOVE) ×3 IMPLANT
GLOVE INDICATOR 8.0 STRL GRN (GLOVE) ×3 IMPLANT
GOWN STRL REUS W/TWL XL LVL3 (GOWN DISPOSABLE) ×9 IMPLANT
GRASPER SUT TROCAR 14GX15 (MISCELLANEOUS) ×2 IMPLANT
HEMOSTAT ARISTA ABSORB 3G PWDR (HEMOSTASIS) IMPLANT
HEMOSTAT SNOW SURGICEL 2X4 (HEMOSTASIS) ×2 IMPLANT
KIT BASIN OR (CUSTOM PROCEDURE TRAY) ×3 IMPLANT
KIT TURNOVER KIT A (KITS) IMPLANT
L-HOOK LAP DISP 36CM (ELECTROSURGICAL)
LHOOK LAP DISP 36CM (ELECTROSURGICAL) IMPLANT
POUCH RETRIEVAL ECOSAC 10 (ENDOMECHANICALS) ×1 IMPLANT
POUCH RETRIEVAL ECOSAC 10MM (ENDOMECHANICALS) ×2
SCISSORS LAP 5X35 DISP (ENDOMECHANICALS) ×3 IMPLANT
SET CHOLANGIOGRAPH MIX (MISCELLANEOUS) ×2 IMPLANT
SET IRRIG TUBING LAPAROSCOPIC (IRRIGATION / IRRIGATOR) ×3 IMPLANT
SET TUBE SMOKE EVAC HIGH FLOW (TUBING) ×3 IMPLANT
SLEEVE XCEL OPT CAN 5 100 (ENDOMECHANICALS) ×6 IMPLANT
SPONGE GAUZE 2X2 STER 10/PKG (GAUZE/BANDAGES/DRESSINGS)
STRIP CLOSURE SKIN 1/2X4 (GAUZE/BANDAGES/DRESSINGS) IMPLANT
SUT MNCRL AB 4-0 PS2 18 (SUTURE) ×5 IMPLANT
SUT VIC AB 0 UR5 27 (SUTURE) IMPLANT
SUT VICRYL 0 TIES 12 18 (SUTURE) IMPLANT
SUT VICRYL 0 UR6 27IN ABS (SUTURE) IMPLANT
TOWEL OR 17X26 10 PK STRL BLUE (TOWEL DISPOSABLE) ×3 IMPLANT
TOWEL OR NON WOVEN STRL DISP B (DISPOSABLE) ×3 IMPLANT
TRAY LAPAROSCOPIC (CUSTOM PROCEDURE TRAY) ×3 IMPLANT
TROCAR BLADELESS OPT 5 100 (ENDOMECHANICALS) ×3 IMPLANT
TROCAR XCEL BLUNT TIP 100MML (ENDOMECHANICALS) IMPLANT
TROCAR XCEL NON-BLD 11X100MML (ENDOMECHANICALS) ×2 IMPLANT

## 2019-10-19 NOTE — ED Notes (Signed)
Report given to [facility] 

## 2019-10-19 NOTE — Anesthesia Preprocedure Evaluation (Addendum)
Anesthesia Evaluation  Patient identified by MRN, date of birth, ID band Patient awake    Reviewed: Allergy & Precautions, NPO status , Patient's Chart, lab work & pertinent test results  History of Anesthesia Complications Negative for: history of anesthetic complications  Airway Mallampati: II  TM Distance: >3 FB Neck ROM: Full    Dental  (+) Teeth Intact   Pulmonary neg pulmonary ROS, former smoker,    Pulmonary exam normal        Cardiovascular negative cardio ROS Normal cardiovascular exam     Neuro/Psych negative neurological ROS  negative psych ROS   GI/Hepatic Neg liver ROS, GERD  ,  Endo/Other  negative endocrine ROS  Renal/GU negative Renal ROS  negative genitourinary   Musculoskeletal negative musculoskeletal ROS (+)   Abdominal   Peds  Hematology negative hematology ROS (+)   Anesthesia Other Findings   Reproductive/Obstetrics                            Anesthesia Physical Anesthesia Plan  ASA: II and emergent  Anesthesia Plan: General   Post-op Pain Management:    Induction: Intravenous, Rapid sequence and Cricoid pressure planned  PONV Risk Score and Plan: 4 or greater and Ondansetron, Dexamethasone, Treatment may vary due to age or medical condition, Midazolam and Scopolamine patch - Pre-op  Airway Management Planned: Oral ETT  Additional Equipment: None  Intra-op Plan:   Post-operative Plan: Extubation in OR  Informed Consent: I have reviewed the patients History and Physical, chart, labs and discussed the procedure including the risks, benefits and alternatives for the proposed anesthesia with the patient or authorized representative who has indicated his/her understanding and acceptance.     Dental advisory given  Plan Discussed with:   Anesthesia Plan Comments:        Anesthesia Quick Evaluation

## 2019-10-19 NOTE — Consult Note (Signed)
Laura Page [birth year]  [id number].    Requesting MD: Dr. Hal Hope  Chief Complaint/Reason for Consult: gallstone pancreatitis  HPI:  This is an otherwise healthy, but obese, white female who began having epigastric abdominal pain starting at 61 yesterday.  This is her fifth episode of similar symptoms but this is the worst so far.  She denies any N/V/D/F/C/CP/OSB/cough, etc.  She has reflux which is otherwise well controlled.  She presented to [facility] and found to have gallstones and some evidence of cholecystitis with small bout of pancreatitis with a lipase in the 300s.  She was transferred to [facility] for [place] to see. Her pain is currently resolved after receiving 2 percocet in the ED.  ROS: ROS: Please see HPI, otherwise all other systems reviewed and are negative.  Family History  Problem Relation Age of Onset  . Cancer Mother   . Stroke Father     Past Medical History:  Diagnosis Date  . Arthritis    fingers  . Dental crown present   . GERD (gastroesophageal reflux disease)   . 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 ABDOMINAL HYSTERECTOMY    . BILATERAL SALPINGOOPHORECTOMY  12/17/2009  . HYSTEROSCOPY W/ ENDOMETRIAL ABLATION  06/09/2008  . SUPRACERVICAL ABDOMINAL HYSTERECTOMY  12/17/2009  . TOTAL SHOULDER ARTHROPLASTY Left 11/27/2016   Procedure: TOTAL SHOULDER ARTHROPLASTY;  Surgeon: Ninetta Lights, MD;  Location: [place];  Service: Orthopedics;  Laterality: Left;    Social History:  reports that she quit smoking about 12 years ago. She has never used smokeless tobacco. She reports that she does not drink alcohol or use drugs.  Allergies: No Known Allergies  Medications Prior to Admission  Medication Sig Dispense Refill  . Calcium-Phosphorus-Vitamin D (CALCIUM/VITAMIN D3/ADULT GUMMY PO) Take 1 capsule by mouth daily.     Marland Kitchen loratadine (CLARITIN) 10 MG tablet Take 10 mg by mouth daily.     . Multiple Vitamins-Minerals (MULTI ADULT GUMMIES PO) Take 2 each by mouth daily.    Marland Kitchen omeprazole (PRILOSEC) 20 MG capsule Take 20 mg by mouth daily.    Marland Kitchen dicyclomine (BENTYL) 20 MG tablet Take 1 tablet (20 mg total) by mouth 2 (two) times daily as needed for spasms. (Patient not taking: Reported on 10/19/2019) 15 tablet 0  . methocarbamol (ROBAXIN) 500 MG tablet Take 1 tablet (500 mg total) by mouth 4 (four) times daily. (Patient not taking: Reported on 10/19/2019) 90 tablet 0  . ondansetron (ZOFRAN) 4 MG tablet Take 1 tablet (4 mg total) by mouth every 8 (eight) hours as needed for nausea or vomiting. (Patient not taking: Reported on 10/19/2019) 40 tablet 0  . oxyCODONE-acetaminophen (PERCOCET) 5-325 MG tablet Take 1-2 tablets by mouth every 4 (four) hours as needed for severe pain. (Patient not taking: Reported on 10/19/2019) 60 tablet 0     Physical Exam: Blood pressure 101/73, pulse 63, temperature (!) 97.4 F (36.3 C), temperature source Oral, resp. rate 18, height '5\' 8"'$  (1.727 m), weight 106.1 kg, SpO2 100 %. General: pleasant, WD, WN, obese white female who is laying in bed in NAD HEENT: head is normocephalic, atraumatic.  Sclera are noninjected.  PERRL.  Ears and nose without any masses or lesions.  Mouth is pink and moist Heart: regular, rate, and rhythm.  Normal s1,s2. No obvious murmurs, gallops, or rubs noted.  Palpable radial and pedal pulses bilaterally Lungs: CTAB, no wheezes, rhonchi, or rales noted.  Respiratory effort nonlabored Abd: soft, mild tenderness with deep palpation in RUQ over gallbladder as expected, ND, but obese, +BS, no masses, hernias, or organomegaly MS: all 4 extremities are symmetrical with no cyanosis, clubbing, or edema. Skin: warm and dry with no masses, lesions, or rashes Psych: A&Ox3 with an appropriate affect.   Results for orders placed or performed during the hospital encounter of 10/18/19 (from the past 48 hour(s))  Basic metabolic panel      Status: Abnormal   Collection Time: 10/18/19  5:42 PM  Result Value Ref Range   Sodium 140 135 - 145 mmol/L   Potassium 4.4 3.5 - 5.1 mmol/L   Chloride 106 98 - 111 mmol/L   CO2 27 22 - 32 mmol/L   Glucose, Bld 106 (H) 70 - 99 mg/dL   BUN 16 8 - 23 mg/dL   Creatinine, Ser 0.78 0.44 - 1.00 mg/dL   Calcium 9.6 8.9 - 10.3 mg/dL   GFR calc non Af Amer >60 >60 mL/min   GFR calc Af Amer >60 >60 mL/min   Anion gap 7 5 - 15    Comment: Performed at [facility], [address] [place], [place] [address]  CBC     Status: None   Collection Time: 10/18/19  5:42 PM  Result Value Ref Range   WBC 9.0 4.0 - 10.5 K/uL   RBC 5.08 3.87 - 5.11 MIL/uL   Hemoglobin 15.0 12.0 - 15.0 g/dL   HCT 44.5 36.0 - 46.0 %   MCV 87.6 80.0 - 100.0 fL   MCH 29.5 26.0 - 34.0 pg   MCHC 33.7 30.0 - 36.0 g/dL   RDW 12.3 11.5 - 15.5 %   Platelets 293 150 - 400 K/uL   nRBC 0.0 0.0 - 0.2 %    Comment: Performed at [facility], [address] [place], [place] [address]  Troponin I (High Sensitivity)     Status: None   Collection Time: 10/18/19  5:42 PM  Result Value Ref Range   Troponin I (High Sensitivity) <2 <18 ng/L    Comment: (NOTE) Elevated high sensitivity troponin I (hsTnI) values and significant  changes across serial measurements may suggest ACS but many other  chronic and acute conditions are known to elevate hsTnI results.  Refer to the Links section for chest pain algorithms and additional  guidance. Performed at [facility], [address] [place], [place] [address]   Hepatic function panel     Status: Abnormal   Collection Time: 10/18/19  5:42 PM  Result Value Ref Range   Total Protein 7.4 6.5 - 8.1 g/dL   Albumin 4.1 3.5 - 5.0 g/dL   AST 90 (H) 15 - 41 U/L   ALT 61 (H) 0 - 44 U/L   Alkaline Phosphatase 143 (H) 38 - 126 U/L   Total Bilirubin 1.2 0.3 - 1.2 mg/dL   Bilirubin, Direct 0.4 (H) 0.0 - 0.2 mg/dL   Indirect Bilirubin 0.8 0.3 - 0.9 mg/dL     Comment: Performed at [facility], [address] [place], [place] [address]  Lipase     Status: Abnormal   Collection Time: 10/18/19  5:42 PM  Result Value Ref Range   Lipase 397 (H) 11 - 51 U/L    Comment: Performed at [facility], [address] [place], [place] [address]  SARS Coronavirus 2 Ag (30 min TAT) - Nasal Swab (BD  Veritor Kit)     Status: None   Collection Time: 10/18/19  8:49 PM   Specimen: Nasal Swab (BD Veritor Kit)  Result Value Ref Range   SARS Coronavirus 2 Ag NEGATIVE NEGATIVE    Comment: (NOTE) SARS-CoV-2 antigen NOT DETECTED.  Negative results are presumptive.  Negative results do not preclude SARS-CoV-2 infection and should not be used as the sole basis for treatment or other patient management decisions, including infection  control decisions, particularly in the presence of clinical signs and  symptoms consistent with COVID-19, or in those who have been in contact with the virus.  Negative results must be combined with clinical observations, patient history, and epidemiological information. The expected result is Negative. Fact Sheet for Patients: PodPark.tn Fact Sheet for Healthcare Providers: GiftContent.is This test is not yet approved or cleared by the [place] FDA and  has been authorized for detection and/or diagnosis of SARS-CoV-2 by FDA under an Emergency Use Authorization (EUA).  This EUA will remain in effect (meaning this test can be used) for the duration of  the COVID-19 de claration under Section 564(b)(1) of the Act, 21 U.S.C. section 360bbb-3(b)(1), unless the authorization is terminated or revoked sooner. Performed at [facility], [address] [place], [place] [address]   SARS CORONAVIRUS 2 (TAT 6-24 HRS) Nasopharyngeal Nasopharyngeal Swab     Status: None   Collection Time: 10/18/19  9:39 PM   Specimen: Nasopharyngeal Swab  Result  Value Ref Range   SARS Coronavirus 2 NEGATIVE NEGATIVE    Comment: (NOTE) SARS-CoV-2 target nucleic acids are NOT DETECTED. The SARS-CoV-2 RNA is generally detectable in upper and lower respiratory specimens during the acute phase of infection. Negative results do not preclude SARS-CoV-2 infection, do not rule out co-infections with other pathogens, and should not be used as the sole basis for treatment or other patient management decisions. Negative results must be combined with clinical observations, patient history, and epidemiological information. The expected result is Negative. Fact Sheet for Patients: SugarRoll.be Fact Sheet for Healthcare Providers: https://www.woods-mathews.com/ This test is not yet approved or cleared by the [place] FDA and  has been authorized for detection and/or diagnosis of SARS-CoV-2 by FDA under an Emergency Use Authorization (EUA). This EUA will remain  in effect (meaning this test can be used) for the duration of the COVID-19 declaration under Section 56 4(b)(1) of the Act, 21 U.S.C. section 360bbb-3(b)(1), unless the authorization is terminated or revoked sooner. Performed at [facility], [place] [address], [place], [place] [address]   Hepatic function panel     Status: Abnormal   Collection Time: 10/19/19  4:28 AM  Result Value Ref Range   Total Protein 6.5 6.5 - 8.1 g/dL   Albumin 3.5 3.5 - 5.0 g/dL   AST 214 (H) 15 - 41 U/L   ALT 182 (H) 0 - 44 U/L   Alkaline Phosphatase 158 (H) 38 - 126 U/L   Total Bilirubin 0.7 0.3 - 1.2 mg/dL   Bilirubin, Direct 0.6 (H) 0.0 - 0.2 mg/dL   Indirect Bilirubin 0.1 (L) 0.3 - 0.9 mg/dL    Comment: Performed at [facility], [place] [address] [place], [place] [address]  Basic metabolic panel     Status: Abnormal   Collection Time: 10/19/19  4:28 AM  Result Value Ref Range   Sodium 141 135 - 145 mmol/L   Potassium 4.0 3.5 - 5.1 mmol/L    Chloride 106 98 - 111 mmol/L  CO2 27 22 - 32 mmol/L   Glucose, Bld 107 (H) 70 - 99 mg/dL   BUN 14 8 - 23 mg/dL   Creatinine, Ser 0.76 0.44 - 1.00 mg/dL   Calcium 9.0 8.9 - 10.3 mg/dL   GFR calc non Af Amer >60 >60 mL/min   GFR calc Af Amer >60 >60 mL/min   Anion gap 8 5 - 15    Comment: Performed at [facility], [place] [address] [place], [place] [address]  CBC WITH DIFFERENTIAL     Status: None   Collection Time: 10/19/19  4:28 AM  Result Value Ref Range   WBC 4.3 4.0 - 10.5 K/uL   RBC 4.55 3.87 - 5.11 MIL/uL   Hemoglobin 13.2 12.0 - 15.0 g/dL   HCT 40.8 36.0 - 46.0 %   MCV 89.7 80.0 - 100.0 fL   MCH 29.0 26.0 - 34.0 pg   MCHC 32.4 30.0 - 36.0 g/dL   RDW 12.6 11.5 - 15.5 %   Platelets 200 150 - 400 K/uL   nRBC 0.0 0.0 - 0.2 %   Neutrophils Relative % 56 %   Neutro Abs 2.4 1.7 - 7.7 K/uL   Lymphocytes Relative 32 %   Lymphs Abs 1.4 0.7 - 4.0 K/uL   Monocytes Relative 9 %   Monocytes Absolute 0.4 0.1 - 1.0 K/uL   Eosinophils Relative 2 %   Eosinophils Absolute 0.1 0.0 - 0.5 K/uL   Basophils Relative 1 %   Basophils Absolute 0.0 0.0 - 0.1 K/uL   Immature Granulocytes 0 %   Abs Immature Granulocytes 0.01 0.00 - 0.07 K/uL    Comment: Performed at [facility], [place] [address], [place], [place] [address]  Lipase, blood     Status: None   Collection Time: 10/19/19  4:28 AM  Result Value Ref Range   Lipase 44 11 - 51 U/L    Comment: Performed at [facility], [place] [address] [place], [place] [address]   DG Chest 2 View  Result Date: 10/18/2019 CLINICAL DATA:  Chest pain, epigastric pain with shortness of breath for 3 hours EXAM: CHEST - 2 VIEW COMPARISON:  None. FINDINGS: No consolidation, features of edema, pneumothorax, or effusion. Pulmonary vascularity is normally distributed. The cardiomediastinal contours are unremarkable. No acute osseous or soft tissue abnormality. Left shoulder hemiarthroplasty in expected  alignment. Degenerative changes are present in the imaged spine and shoulders. IMPRESSION: No acute cardiopulmonary abnormality. Electronically Signed   By: Lovena Le M.D.   On: 10/18/2019 18:20   US Abdomen Limited RUQ  Result Date: 10/18/2019 CLINICAL DATA:  Epigastric pain EXAM: ULTRASOUND ABDOMEN LIMITED RIGHT UPPER QUADRANT COMPARISON:  Ultrasound abdomen limited dated 02/03/2017. FINDINGS: Gallbladder: The gallbladder wall is borderline thickened measuring approximately 3 mm. Gallstones are noted. Gallbladder sludge is noted. The sonographic Percell Miller sign is reported as positive. There is no definite pericholecystic free fluid. Common bile duct: Diameter: 4 mm Liver: Diffuse increased echogenicity with slightly heterogeneous liver. Appearance typically secondary to fatty infiltration. Fibrosis secondary consideration. No secondary findings of cirrhosis noted. No focal hepatic lesion or intrahepatic biliary duct dilatation. Portal vein is patent on color Doppler imaging with normal direction of blood flow towards the liver. Other: None. IMPRESSION: Overall findings consistent with early acute calculus cholecystitis in the appropriate clinical setting Electronically Signed   By: Constance Holster M.D.   On: 10/18/2019 18:46      Assessment/Plan GERD  Gallstone pancreatitis, mild The patient was  admitted with gallstone pancreatitis.  Her TB is normal with a normal caliber CBD.  She likely has passed a stone.  She may have some cholecystitis as well.  She will continue on Rocephin.  We will plan to proceed with lap chole and IOC today.  If she is found to have a +IOC then we will contact GI at that time. I have explained the procedure, risks, and aftercare of cholecystectomy.  Risks include but are not limited to bleeding, infection, wound problems, anesthesia, diarrhea, bile leak, injury to common bile duct/liver/intestine.  She seems to understand and agrees to proceed.  FEN - NPO for OR today  VTE - will start post op ID - Zosyn, will DC and continue Rocephin  Henreitta Cea, [facility] 10/19/2019, 7:16 AM Please see Amion for pager number during day hours 7:00am-4:30pm

## 2019-10-19 NOTE — Op Note (Signed)
Laura Page [id number] Apr 13, 1956 10/19/2019  Laparoscopic Cholecystectomy with IOC Procedure Note  Indications: This patient presents with symptomatic gallbladder disease and will undergo laparoscopic cholecystectomy.  Pre-operative Diagnosis: Calculus of gallbladder with acute cholecystitis, without mention of obstruction  Post-operative Diagnosis: Same  Surgeon: Greer Pickerel MD FACS  Assistants: Will Creig Hines PA-C  Anesthesia: General endotracheal anesthesia   Procedure Details  The patient was seen again in the Holding Room. The risks, benefits, complications, treatment options, and expected outcomes were discussed with the patient. The possibilities of reaction to medication, pulmonary aspiration, perforation of viscus, bleeding, recurrent infection, finding a normal gallbladder, the need for additional procedures, failure to diagnose a condition, the possible need to convert to an open procedure, and creating a complication requiring transfusion or operation were discussed with the patient. The likelihood of improving the patient's symptoms with return to their baseline status is good.  The patient and/or family concurred with the proposed plan, giving informed consent. The site of surgery properly noted. The patient was taken to Operating Room, identified as Laura Page and the procedure verified as Laparoscopic Cholecystectomy with Intraoperative Cholangiogram. A Time Out was held and the above information confirmed. Antibiotic prophylaxis was administered.   Prior to the induction of general anesthesia, antibiotic prophylaxis was administered. General endotracheal anesthesia was then administered and tolerated well. After the induction, the abdomen was prepped with Chloraprep and draped in the sterile fashion. The patient was positioned in the supine position.  Local anesthetic agent was injected into the skin near the umbilicus and an incision made. We dissected down  to the abdominal fascia with blunt dissection. Her abdominal wall was quite thick.  I would estimate her soft tissue space was at least 5 inches thick.  Therefore I decided to gain access to the abdomen using Optiview technique.  A small incision was made in the left upper abdomen just below the left subcostal margin a little bit medial to the midclavicular line.  Then using a 0 degree 5 mm laparoscope through a 5 mm trocar I advanced it through all layers of the abdominal wall and entered the abdominal cavity.  There is no evidence of injury to surrounding structures.  The patient had a very fatty bulky falciform ligament.  I was able to navigate the camera underneath the falciform and visualize right upper quadrant.  I placed an 11 mm trocar in the supraumbilical position.  .  Two 5-mm ports were placed in the right upper quadrant. All skin incisions were infiltrated with a local anesthetic agent before making the incision and placing the trocars.   We positioned the patient in reverse Trendelenburg, tilted slightly to the patient's left.  The gallbladder was identified, the fundus grasped and retracted cephalad.  Some omental adhesions to the base of the right hepatic lobe were taken down in order to facilitate retraction of the gallbladder.  There was edema in the gallbladder wall.  Adhesions were lysed bluntly and with the electrocautery where indicated, taking care not to injure any adjacent organs or viscus. The infundibulum was grasped and retracted laterally, exposing the peritoneum overlying the triangle of Calot. This was then divided and exposed in a blunt fashion. A critical view of the cystic duct and cystic artery was obtained.  The cystic duct was clearly identified and bluntly dissected circumferentially. The cystic duct was ligated with a clip distally.   An incision was made in the cystic duct and the [facility] cholangiogram catheter introduced. The catheter was secured  using a clip. A cholangiogram  was then obtained which showed good visualization of the distal and proximal biliary tree with no sign of filling defects or obstruction.  Contrast flowed easily into the duodenum. The catheter was then removed.   The cystic duct was then ligated with clips and divided. The cystic artery which had been identified & dissected free was ligated with clips and divided as well.  This included both a very small anterior branch as well as the posterior branch each were isolated and clipped.  The gallbladder was dissected from the liver bed in retrograde fashion with the electrocautery. The gallbladder was removed and placed in an Ecco sac. The liver bed was irrigated and inspected. Hemostasis was achieved with the electrocautery. Copious irrigation was utilized and was repeatedly aspirated until clear.  I did elect to place a piece of surgical snow in the gallbladder fossa.  The gallbladder and Ecco sac were then removed through the umbilical port site.  I then closed the umbilical fascia with 3 interrupted 0 Vicryls using the PMI suture passer with laparoscopic assistance.  We again inspected the right upper quadrant for hemostasis.  The umbilical closure was inspected and there was no air leak and nothing trapped within the closure. Pneumoperitoneum was released as we removed the trocars.  4-0 Monocryl was used to close the skin.  Dermabond was applied. The patient was then extubated and brought to the recovery room in stable condition. Instrument, sponge, and needle counts were correct at closure and at the conclusion of the case.   Findings: Cholecystitis with Cholelithiasis +critical view nml IOC +SNOW  Estimated Blood Loss: Minimal         Drains: none         Specimens: Gallbladder           Complications: None; patient tolerated the procedure well.         Disposition: PACU - hemodynamically stable.         Condition: stable  Laura Sella. Andrey Campanile, MD, FACS General, Bariatric, & Minimally  Invasive Surgery [facility], [place]

## 2019-10-19 NOTE — ED Notes (Signed)
Pt ambulated to restroom without difficulty

## 2019-10-19 NOTE — Anesthesia Postprocedure Evaluation (Signed)
Anesthesia Post Note  Patient: Laura Page  Procedure(s) Performed: LAPAROSCOPIC CHOLECYSTECTOMY WITH INTRAOPERATIVE CHOLANGIOGRAM (N/A Abdomen)     Patient location during evaluation: PACU Anesthesia Type: General Level of consciousness: awake and alert Pain management: pain level controlled Vital Signs Assessment: post-procedure vital signs reviewed and stable Respiratory status: spontaneous breathing, nonlabored ventilation and respiratory function stable Cardiovascular status: blood pressure returned to baseline and stable Postop Assessment: no apparent nausea or vomiting Anesthetic complications: no    Last Vitals:  Vitals:   10/19/19 1844 10/19/19 2005  BP: 130/79 137/84  Pulse: 69 74  Resp: 16   Temp: 36.7 C 36.8 C  SpO2: 100% 96%    Last Pain:  Vitals:   10/19/19 2017  TempSrc:   PainSc: [place]

## 2019-10-19 NOTE — H&P (Signed)
History and Physical    Laura Page UQJ:[id number] DOB: [birth year] DOA: 10/18/2019  PCP: System, Pcp Not In  Patient coming from: Home.  Chief Complaint: Abdominal pain.  HPI: Laura Page is a 63 y.o. female with history of GERD presents to the ER admits in [facility] with complaint of sudden onset of severe abdominal pain mostly in the epigastric area.  Denies any nausea vomiting or diarrhea.  Denies any fever chills.  Pain is mostly epigastric area stabbing in nature radiating to the back.  Patient states he has had similar attacks at least 5 times over the last 2 years.  ED Course: In the ER on exam patient has epigastric tenderness was afebrile.  Chest x-ray unremarkable EKG showed normal sinus rhythm high-sensitivity troponin was negative.  Labs show elevated lipase of 397 AST 90 ALT 61 and sonogram of the abdomen shows features consistent acute cholecystitis.  Since LFTs were elevated with elevated lipase Dr. Cristina Gong of gastroenterology was consulted who requested general surgery consult.  Dr. Lucia Gaskins was consulted and will be seeing patient in consult.  Patient was started on empiric antibiotics and admitted for further management.  Review of Systems: As per HPI, rest all negative.   Past Medical History:  Diagnosis Date  . Arthritis    fingers  . Dental crown present   . GERD (gastroesophageal reflux disease)   . 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 ABDOMINAL HYSTERECTOMY    . BILATERAL SALPINGOOPHORECTOMY  12/17/2009  . HYSTEROSCOPY W/ ENDOMETRIAL ABLATION  06/09/2008  . SUPRACERVICAL ABDOMINAL HYSTERECTOMY  12/17/2009  . TOTAL SHOULDER ARTHROPLASTY Left 11/27/2016   Procedure: TOTAL SHOULDER ARTHROPLASTY;  Surgeon: Ninetta Lights, MD;  Location: [place];  Service: Orthopedics;  Laterality: Left;     reports that she quit smoking about 12 years ago. She has never used smokeless  tobacco. She reports that she does not drink alcohol or use drugs.  No Known Allergies  Family History  Problem Relation Age of Onset  . Cancer Mother   . Stroke Father     Prior to Admission medications   Medication Sig Start Date End Date Taking? Authorizing Provider  Calcium-Phosphorus-Vitamin D (CALCIUM/VITAMIN D3/ADULT GUMMY PO) Take 1 capsule by mouth daily.    Yes [provider]  loratadine (CLARITIN) 10 MG tablet Take 10 mg by mouth daily.   Yes [provider]  Multiple Vitamins-Minerals (MULTI ADULT GUMMIES PO) Take 2 each by mouth daily.   Yes [provider]  omeprazole (PRILOSEC) 20 MG capsule Take 20 mg by mouth daily.   Yes [provider]  dicyclomine (BENTYL) 20 MG tablet Take 1 tablet (20 mg total) by mouth 2 (two) times daily as needed for spasms. Patient not taking: Reported on 10/19/2019 02/03/17   Quintella Reichert, MD  methocarbamol (ROBAXIN) 500 MG tablet Take 1 tablet (500 mg total) by mouth 4 (four) times daily. Patient not taking: Reported on 10/19/2019 11/27/16   Aundra Dubin, PA-C  ondansetron (ZOFRAN) 4 MG tablet Take 1 tablet (4 mg total) by mouth every 8 (eight) hours as needed for nausea or vomiting. Patient not taking: Reported on 10/19/2019 11/27/16   Aundra Dubin, PA-C  oxyCODONE-acetaminophen (PERCOCET) 5-325 MG tablet Take 1-2 tablets by mouth every 4 (four) hours as needed for severe pain. Patient not taking: Reported on 10/19/2019 11/27/16   Aundra Dubin, PA-C    Physical Exam:  Constitutional: Moderately built and nourished. Vitals:   10/18/19 1951 10/18/19 2000 10/19/19 0007 10/19/19 0154  BP: (!) 145/82 (!) 160/92 132/73 (!) 127/91  Pulse: 74 76 69 69  Resp: _0 Temp:   97.8 F (36.6 C) 97.9 F (36.6 C)  TempSrc:   Oral Oral  SpO2: 100% 100% 97% 100%  Weight:      Height:       Eyes: Anicteric no pallor. ENMT: No discharge from the ears eyes nose or mouth. Neck: No mass felt.  No  neck rigidity. Respiratory: No rhonchi or crepitations. Cardiovascular: S1-S2 heard. Abdomen: Mild epigastric tenderness no guarding or rigidity. Musculoskeletal: No edema. Skin: No rash. Neurologic: Alert awake oriented to time place and person.  Moves all extremities. Psychiatric: Appears normal per normal affect.   Labs on Admission: I have personally reviewed following labs and imaging studies  CBC: Recent Labs  Lab 10/18/19 1742  WBC 9.0  HGB 15.0  HCT 44.5  MCV 87.6  PLT 564   Basic Metabolic Panel: Recent Labs  Lab 10/18/19 1742  NA 140  K 4.4  CL 106  CO2 27  GLUCOSE 106*  BUN 16  CREATININE 0.78  CALCIUM 9.6   GFR: Estimated Creatinine Clearance: 93 mL/min (by C-G formula based on SCr of 0.78 mg/dL). Liver Function Tests: Recent Labs  Lab 10/18/19 1742  AST 90*  ALT 61*  ALKPHOS 143*  BILITOT 1.2  PROT 7.4  ALBUMIN 4.1   Recent Labs  Lab 10/18/19 1742  LIPASE 397*   No results for input(s): AMMONIA in the last 168 hours. Coagulation Profile: No results for input(s): INR, PROTIME in the last 168 hours. Cardiac Enzymes: No results for input(s): CKTOTAL, CKMB, CKMBINDEX, TROPONINI in the last 168 hours. BNP (last 3 results) No results for input(s): PROBNP in the last 8760 hours. HbA1C: No results for input(s): HGBA1C in the last 72 hours. CBG: No results for input(s): GLUCAP in the last 168 hours. Lipid Profile: No results for input(s): CHOL, HDL, LDLCALC, TRIG, CHOLHDL, LDLDIRECT in the last 72 hours. Thyroid Function Tests: No results for input(s): TSH, T4TOTAL, FREET4, T3FREE, THYROIDAB in the last 72 hours. Anemia Panel: No results for input(s): VITAMINB12, FOLATE, FERRITIN, TIBC, IRON, RETICCTPCT in the last 72 hours. Urine analysis:    Component Value Date/Time   COLORURINE YELLOW 12/04/2009 2046   APPEARANCEUR CLOUDY (A) 12/04/2009 2046   LABSPEC 1.015 12/04/2009 2046   PHURINE 7.0 12/04/2009 2046   GLUCOSEU NEGATIVE 12/04/2009  2046   HGBUR MODERATE (A) 12/04/2009 2046   BILIRUBINUR NEGATIVE 12/04/2009 2046   KETONESUR NEGATIVE 12/04/2009 2046   PROTEINUR NEGATIVE 12/04/2009 2046   UROBILINOGEN 0.2 12/04/2009 2046   NITRITE NEGATIVE 12/04/2009 2046   LEUKOCYTESUR NEGATIVE 12/04/2009 2046   Sepsis Labs: _1 (procalcitonin:4,lacticidven:4) ) Recent Results (from the past 240 hour(s))  SARS Coronavirus 2 Ag (30 min TAT) - Nasal Swab (BD Veritor Kit)     Status: None   Collection Time: 10/18/19  8:49 PM   Specimen: Nasal Swab (BD Veritor Kit)  Result Value Ref Range Status   SARS Coronavirus 2 Ag NEGATIVE NEGATIVE Final    Comment: (NOTE) SARS-CoV-2 antigen NOT DETECTED.  Negative results are presumptive.  Negative results do not preclude SARS-CoV-2 infection and should not be used as the sole basis for treatment or other patient management decisions, including infection  control decisions, particularly in the presence of clinical signs and  symptoms consistent with COVID-19, or in those who  have been in contact with the virus.  Negative results must be combined with clinical observations, patient history, and epidemiological information. The expected result is Negative. Fact Sheet for Patients: PodPark.tn Fact Sheet for Healthcare Providers: GiftContent.is This test is not yet approved or cleared by the [place] FDA and  has been authorized for detection and/or diagnosis of SARS-CoV-2 by FDA under an Emergency Use Authorization (EUA).  This EUA will remain in effect (meaning this test can be used) for the duration of  the COVID-19 de claration under Section 564(b)(1) of the Act, 21 U.S.C. section 360bbb-3(b)(1), unless the authorization is terminated or revoked sooner. Performed at [facility], [address] [place], [place] [address]      Radiological Exams on Admission: DG Chest 2 View  Result Date:  10/18/2019 CLINICAL DATA:  Chest pain, epigastric pain with shortness of breath for 3 hours EXAM: CHEST - 2 VIEW COMPARISON:  None. FINDINGS: No consolidation, features of edema, pneumothorax, or effusion. Pulmonary vascularity is normally distributed. The cardiomediastinal contours are unremarkable. No acute osseous or soft tissue abnormality. Left shoulder hemiarthroplasty in expected alignment. Degenerative changes are present in the imaged spine and shoulders. IMPRESSION: No acute cardiopulmonary abnormality. Electronically Signed   By: Lovena Le M.D.   On: 10/18/2019 18:20   US Abdomen Limited RUQ  Result Date: 10/18/2019 CLINICAL DATA:  Epigastric pain EXAM: ULTRASOUND ABDOMEN LIMITED RIGHT UPPER QUADRANT COMPARISON:  Ultrasound abdomen limited dated 02/03/2017. FINDINGS: Gallbladder: The gallbladder wall is borderline thickened measuring approximately 3 mm. Gallstones are noted. Gallbladder sludge is noted. The sonographic Percell Miller sign is reported as positive. There is no definite pericholecystic free fluid. Common bile duct: Diameter: 4 mm Liver: Diffuse increased echogenicity with slightly heterogeneous liver. Appearance typically secondary to fatty infiltration. Fibrosis secondary consideration. No secondary findings of cirrhosis noted. No focal hepatic lesion or intrahepatic biliary duct dilatation. Portal vein is patent on color Doppler imaging with normal direction of blood flow towards the liver. Other: None. IMPRESSION: Overall findings consistent with early acute calculus cholecystitis in the appropriate clinical setting Electronically Signed   By: Constance Holster M.D.   On: 10/18/2019 18:46    EKG: Independently reviewed.  Normal sinus rhythm.  Assessment/Plan Principal Problem:   Acute cholecystitis Active Problems:   Acute pancreatitis    1. Acute cholecystitis with possible gallstone pancreatitis for which we will keep patient n.p.o. IV fluids empiric antibiotics General  surgery and gastroenterologist was consulted.  If LFTs shows increasing trend will get MRCP.  Continue with IV fluids pain relief medications. 2. History of GERD.  COVID-19 test is pending.  Since patient has acute cholecystitis with possible gallstone pancreatitis will need more than 2 midnight stay in inpatient status.   DVT prophylaxis: SCDs in anticipation of procedure. Code Status: Full code. Family Communication: Discussed with patient. Disposition Plan: Home. Consults called: General surgery and gastroenterology. Admission status: Inpatient.   Rise Patience MD [facility] Pager [phone].  If 7PM-7AM, please contact night-coverage [email or web address] Password [email or web address]  10/19/2019, 3:17 AM

## 2019-10-19 NOTE — Progress Notes (Signed)
rm 1321, Laura Page, 63yF, Just arrived from [facility] with [facility].  Needs orders. Patient is alert and oriented X3.  Independent.   Denies pain at this time.  Dr. Hal Hope was paged to get new orders.

## 2019-10-19 NOTE — Transfer of Care (Signed)
Immediate Anesthesia Transfer of Care Note  Patient: Laura Page  Procedure(s) Performed: LAPAROSCOPIC CHOLECYSTECTOMY WITH INTRAOPERATIVE CHOLANGIOGRAM (N/A Abdomen)  Patient Location: PACU  Anesthesia Type:General  Level of Consciousness: awake, alert , oriented and patient cooperative  Airway & Oxygen Therapy: Patient Spontanous Breathing and Patient connected to face mask oxygen  Post-op Assessment: Report given to RN and Post -op Vital signs reviewed and stable  Post vital signs: Reviewed and stable  Last Vitals:  Vitals Value Taken Time  BP 131/80 10/19/19 1800  Temp 36.4 C 10/19/19 1717  Pulse 67 10/19/19 1814  Resp 15 10/19/19 1814  SpO2 99 % 10/19/19 1814  Vitals shown include unvalidated device data.  Last Pain:  Vitals:   10/19/19 1745  TempSrc:   PainSc: 4       Patients Stated Pain Goal: 4 (19/75/88 3254)  Complications: No apparent anesthesia complications

## 2019-10-19 NOTE — Progress Notes (Signed)
Pharmacy Antibiotic Note  Laura Page is a 63 y.o. female admitted on 10/18/2019 with Intra-abdominal infection.  Pharmacy has been consulted for Zosyn dosing.  Plan: Zosyn 3.375g IV q8h (4 hour infusion).  Height: 5\' 8"  (172.7 cm) Weight: 234 lb (106.1 kg) IBW/kg (Calculated) : 63.9  Temp (24hrs), Avg:98.2 F (36.8 C), Min:97.8 F (36.6 C), Max:98.8 F (37.1 C)  Recent Labs  Lab 10/18/19 1742  WBC 9.0  CREATININE 0.78    Estimated Creatinine Clearance: 93 mL/min (by C-G formula based on SCr of 0.78 mg/dL).    No Known Allergies  Antimicrobials this admission: Vancomycin 10/19/2019 >> Zosyn 10/19/2019 >>   Dose adjustments this admission: -  Microbiology results: -  Thank you for allowing pharmacy to be a part of this patient's care.  Nani Skillern Crowford 10/19/2019 3:27 AM

## 2019-10-19 NOTE — ED Notes (Signed)
ED TO INPATIENT HANDOFF REPORT  ED Nurse Name and Phone #:   Lattie Haw [phone]  S Name/Age/Gender Laura Page 63 y.o. female Room/Bed: MH05/MH05  Code Status   Code Status: Prior  Home/SNF/Other Home Patient oriented to: self, place, time and situation Is this baseline? Yes   Triage Complete: Triage complete  Chief Complaint Acute cholecystitis [K81.0]  Triage Note Epigastric pain with SOB x 3 hours.     Allergies No Known Allergies  Level of Care/Admitting Diagnosis ED Disposition    ED Disposition Condition Comment   Admit  Hospital Area: [facility] [[id number]]  Level of Care: Med-Surg [16]  Covid Evaluation: Asymptomatic Screening Protocol (No Symptoms)  Diagnosis: Acute cholecystitis [575.0.ICD-9-CM]  Admitting Physician: Rise Patience [[id number]]  Attending Physician: Rise Patience [phone]  Estimated length of stay: past midnight tomorrow  Certification:: I certify this patient will need inpatient services for at least 2 midnights       B Medical/Surgery History Past Medical History:  Diagnosis Date  . Arthritis    fingers  . Dental crown present   . GERD (gastroesophageal reflux disease)   . Osteoarthritis of left shoulder 11/2016   Past Surgical History:  Procedure Laterality Date  . ABDOMINAL HYSTERECTOMY    . BILATERAL SALPINGOOPHORECTOMY  12/17/2009  . HYSTEROSCOPY W/ ENDOMETRIAL ABLATION  06/09/2008  . SUPRACERVICAL ABDOMINAL HYSTERECTOMY  12/17/2009  . TOTAL SHOULDER ARTHROPLASTY Left 11/27/2016   Procedure: TOTAL SHOULDER ARTHROPLASTY;  Surgeon: Ninetta Lights, MD;  Location: [place];  Service: Orthopedics;  Laterality: Left;     A IV Location/Drains/Wounds Patient Lines/Drains/Airways Status   Active Line/Drains/Airways    Name:   Placement date:   Placement time:   Site:   Days:   Peripheral IV 10/18/19 Right Antecubital   10/18/19    2131    Antecubital   1   Incision (Closed)  11/27/16 Shoulder Left   11/27/16    0817     1056          Intake/Output Last 24 hours  Intake/Output Summary (Last 24 hours) at 10/19/2019 0046 Last data filed at 10/18/2019 2208 Gross per 24 hour  Intake 100.02 ml  Output --  Net 100.02 ml    Labs/Imaging Results for orders placed or performed during the hospital encounter of 10/18/19 (from the past 48 hour(s))  Basic metabolic panel     Status: Abnormal   Collection Time: 10/18/19  5:42 PM  Result Value Ref Range   Sodium 140 135 - 145 mmol/L   Potassium 4.4 3.5 - 5.1 mmol/L   Chloride 106 98 - 111 mmol/L   CO2 27 22 - 32 mmol/L   Glucose, Bld 106 (H) 70 - 99 mg/dL   BUN 16 8 - 23 mg/dL   Creatinine, Ser 0.78 0.44 - 1.00 mg/dL   Calcium 9.6 8.9 - 10.3 mg/dL   GFR calc non Af Amer >60 >60 mL/min   GFR calc Af Amer >60 >60 mL/min   Anion gap 7 5 - 15    Comment: Performed at [facility], [address] [place], [place] [address]  CBC     Status: None   Collection Time: 10/18/19  5:42 PM  Result Value Ref Range   WBC 9.0 4.0 - 10.5 K/uL   RBC 5.08 3.87 - 5.11 MIL/uL   Hemoglobin 15.0 12.0 - 15.0 g/dL   HCT 44.5 36.0 - 46.0 %   MCV 87.6 80.0 -  100.0 fL   MCH 29.5 26.0 - 34.0 pg   MCHC 33.7 30.0 - 36.0 g/dL   RDW 12.3 11.5 - 15.5 %   Platelets 293 150 - 400 K/uL   nRBC 0.0 0.0 - 0.2 %    Comment: Performed at [facility], [address] [place], [place] [address]  Troponin I (High Sensitivity)     Status: None   Collection Time: 10/18/19  5:42 PM  Result Value Ref Range   Troponin I (High Sensitivity) <2 <18 ng/L    Comment: (NOTE) Elevated high sensitivity troponin I (hsTnI) values and significant  changes across serial measurements may suggest ACS but many other  chronic and acute conditions are known to elevate hsTnI results.  Refer to the Links section for chest pain algorithms and additional  guidance. Performed at [facility], [address] [place], [place]  [address]   Hepatic function panel     Status: Abnormal   Collection Time: 10/18/19  5:42 PM  Result Value Ref Range   Total Protein 7.4 6.5 - 8.1 g/dL   Albumin 4.1 3.5 - 5.0 g/dL   AST 90 (H) 15 - 41 U/L   ALT 61 (H) 0 - 44 U/L   Alkaline Phosphatase 143 (H) 38 - 126 U/L   Total Bilirubin 1.2 0.3 - 1.2 mg/dL   Bilirubin, Direct 0.4 (H) 0.0 - 0.2 mg/dL   Indirect Bilirubin 0.8 0.3 - 0.9 mg/dL    Comment: Performed at [facility], [address] [place], [place] [address]  Lipase     Status: Abnormal   Collection Time: 10/18/19  5:42 PM  Result Value Ref Range   Lipase 397 (H) 11 - 51 U/L    Comment: Performed at [facility], [address] [place], [place] [address]  SARS Coronavirus 2 Ag (30 min TAT) - Nasal Swab (BD Veritor Kit)     Status: None   Collection Time: 10/18/19  8:49 PM   Specimen: Nasal Swab (BD Veritor Kit)  Result Value Ref Range   SARS Coronavirus 2 Ag NEGATIVE NEGATIVE    Comment: (NOTE) SARS-CoV-2 antigen NOT DETECTED.  Negative results are presumptive.  Negative results do not preclude SARS-CoV-2 infection and should not be used as the sole basis for treatment or other patient management decisions, including infection  control decisions, particularly in the presence of clinical signs and  symptoms consistent with COVID-19, or in those who have been in contact with the virus.  Negative results must be combined with clinical observations, patient history, and epidemiological information. The expected result is Negative. Fact Sheet for Patients: PodPark.tn Fact Sheet for Healthcare Providers: GiftContent.is This test is not yet approved or cleared by the [place] FDA and  has been authorized for detection and/or diagnosis of SARS-CoV-2 by FDA under an Emergency Use Authorization (EUA).  This EUA will remain in effect (meaning this test can be used) for the duration of  the  COVID-19 de claration under Section 564(b)(1) of the Act, 21 U.S.C. section 360bbb-3(b)(1), unless the authorization is terminated or revoked sooner. Performed at [facility], [address] [place], [place] [address]    DG Chest 2 View  Result Date: 10/18/2019 CLINICAL DATA:  Chest pain, epigastric pain with shortness of breath for 3 hours EXAM: CHEST - 2 VIEW COMPARISON:  None. FINDINGS: No consolidation, features of edema, pneumothorax, or effusion. Pulmonary vascularity is normally  distributed. The cardiomediastinal contours are unremarkable. No acute osseous or soft tissue abnormality. Left shoulder hemiarthroplasty in expected alignment. Degenerative changes are present in the imaged spine and shoulders. IMPRESSION: No acute cardiopulmonary abnormality. Electronically Signed   By: Lovena Le M.D.   On: 10/18/2019 18:20   US Abdomen Limited RUQ  Result Date: 10/18/2019 CLINICAL DATA:  Epigastric pain EXAM: ULTRASOUND ABDOMEN LIMITED RIGHT UPPER QUADRANT COMPARISON:  Ultrasound abdomen limited dated 02/03/2017. FINDINGS: Gallbladder: The gallbladder wall is borderline thickened measuring approximately 3 mm. Gallstones are noted. Gallbladder sludge is noted. The sonographic Percell Miller sign is reported as positive. There is no definite pericholecystic free fluid. Common bile duct: Diameter: 4 mm Liver: Diffuse increased echogenicity with slightly heterogeneous liver. Appearance typically secondary to fatty infiltration. Fibrosis secondary consideration. No secondary findings of cirrhosis noted. No focal hepatic lesion or intrahepatic biliary duct dilatation. Portal vein is patent on color Doppler imaging with normal direction of blood flow towards the liver. Other: None. IMPRESSION: Overall findings consistent with early acute calculus cholecystitis in the appropriate clinical setting Electronically Signed   By: Constance Holster M.D.   On: 10/18/2019 18:46    Pending Labs Unresulted  Labs (From admission, onward)    Start     Ordered   10/18/19 2125  SARS CORONAVIRUS 2 (TAT 6-24 HRS) Nasopharyngeal Nasopharyngeal Swab  (Tier 3 (TAT 6-24 hrs))  Once,   STAT    Question Answer Comment  Is this test for diagnosis or screening Screening   Symptomatic for COVID-19 as defined by CDC No   Hospitalized for COVID-19 No   Admitted to ICU for COVID-19 No   Previously tested for COVID-19 Yes   Resident in a congregate (group) care setting No   Employed in healthcare setting No   Pregnant No      10/18/19 2125          Vitals/Pain Today's Vitals   10/18/19 2000 10/18/19 2203 10/18/19 2345 10/19/19 0007  BP: (!) 160/92   132/73  Pulse: 76   69  Resp: 15   14  Temp:    97.8 F (36.6 C)  TempSrc:    Oral  SpO2: 100%   97%  Weight:      Height:      PainSc:  0-No pain 0-No pain 0-No pain    Isolation Precautions No active isolations  Medications Medications  oxyCODONE-acetaminophen (PERCOCET/ROXICET) 5-325 MG per tablet 2 tablet (2 tablets Oral Given 10/18/19 1956)  cefTRIAXone (ROCEPHIN) 2 g in sodium chloride 0.9 % 100 mL IVPB (0 g Intravenous Stopped 10/18/19 2208)    Mobility walks Low fall risk   Focused Assessments none    R Recommendations: See Admitting Provider Note  Report given to:   Additional Notes: none

## 2019-10-19 NOTE — Discharge Instructions (Signed)
[facility] ______[facility] °LAPAROSCOPIC SURGERY: POST OP INSTRUCTIONS °Always review your discharge instruction sheet given to you by the facility where your surgery was performed. °IF YOU HAVE DISABILITY OR FAMILY LEAVE FORMS, YOU MUST BRING THEM TO THE OFFICE FOR PROCESSING.   °DO NOT GIVE THEM TO YOUR DOCTOR. ° °1. A prescription for pain medication may be given to you upon discharge.  Take your pain medication as prescribed, if needed.  If narcotic pain medicine is not needed, then you may take acetaminophen (Tylenol) or ibuprofen (Advil) as needed. °2. Take your usually prescribed medications unless otherwise directed. °3. If you need a refill on your pain medication, please contact your pharmacy.  They will contact our office to request authorization. Prescriptions will not be filled after 5pm or on week-ends. °4. You should follow a light diet the first few days after arrival home, such as soup and crackers, etc.  Be sure to include lots of fluids daily. °5. Most patients will experience some swelling and bruising in the area of the incisions.  Ice packs will help.  Swelling and bruising can take several days to resolve.  °6. It is common to experience some constipation if taking pain medication after surgery.  Increasing fluid intake and taking a stool softener (such as Colace) will usually help or prevent this problem from occurring.  A mild laxative (Milk of Magnesia or Miralax) should be taken according to package instructions if there are no bowel movements after 48 hours. °7. Unless discharge instructions indicate otherwise, you may remove your bandages 24-48 hours after surgery, and you may shower at that time.  You may have steri-strips (small skin tapes) in place directly over the incision.  These strips should be left on the skin for 7-10 days.  If your surgeon used skin glue on the incision, you may shower in 24 hours.  The glue will flake off over the next 2-3 weeks.  Any sutures or  staples will be removed at the office during your follow-up visit. °8. ACTIVITIES:  You may resume regular (light) daily activities beginning the next day--such as daily self-care, walking, climbing stairs--gradually increasing activities as tolerated.  You may have sexual intercourse when it is comfortable.  Refrain from any heavy lifting or straining until approved by your doctor. °a. You may drive when you are no longer taking prescription pain medication, you can comfortably wear a seatbelt, and you can safely maneuver your car and apply brakes. °b. RETURN TO WORK:  __________________________________________________________ °9. You should see your doctor in the office for a follow-up appointment approximately 2-3 weeks after your surgery.  Make sure that you call for this appointment within a day or two after you arrive home to insure a convenient appointment time. °10. OTHER INSTRUCTIONS: __________________________________________________________________________________________________________________________ __________________________________________________________________________________________________________________________ °WHEN TO CALL YOUR DOCTOR: °1. Fever over 101.0 °2. Inability to urinate °3. Continued bleeding from incision. °4. Increased pain, redness, or drainage from the incision. °5. Increasing abdominal pain ° °The clinic staff is available to answer your questions during regular business hours.  Please don’t hesitate to call and ask to speak to one of the nurses for clinical concerns.  If you have a medical emergency, go to the nearest emergency room or call 911.  A surgeon from [facility] is always on call at the hospital. °[address], [address], [place], [place]  [address] ? [address] [address], [place], [place]   [address] °[phone] ? [phone] ? FAX [phone] °Web site:   [email or web address] ° ° °Gallbladder Eating Plan °If you have a gallbladder  condition, you may have trouble digesting fats. Eating a low-fat diet can help reduce your symptoms, and may be helpful before and after having surgery to remove your gallbladder (cholecystectomy). Your health care provider may recommend that you work with a diet and nutrition specialist (dietitian) to help you reduce the amount of fat in your diet. °What are tips for following this plan? °General guidelines °· Limit your fat intake to less than 30% of your total daily calories. If you eat around 1,800 calories each day, this is less than 60 grams (g) of fat per day. °· Fat is an important part of a healthy diet. Eating a low-fat diet can make it hard to maintain a healthy body weight. Ask your dietitian how much fat, calories, and other nutrients you need each day. °· Eat small, frequent meals throughout the day instead of three large meals. °· Drink at least 8-10 cups of fluid a day. Drink enough fluid to keep your urine clear or pale yellow. °· Limit alcohol intake to no more than 1 drink a day for nonpregnant women and 2 drinks a day for men. One drink equals 12 oz of beer, 5 oz of wine, or 1½ oz of hard liquor. °Reading food labels °· Check Nutrition Facts on food labels for the amount of fat per serving. Choose foods with less than 3 grams of fat per serving. °Shopping °· Choose nonfat and low-fat healthy foods. Look for the words “nonfat,” “low fat,” or “fat free.” °· Avoid buying processed or prepackaged foods. °Cooking °· Cook using low-fat methods, such as baking, broiling, grilling, or boiling. °· Cook with small amounts of healthy fats, such as olive oil, grapeseed oil, canola oil, or sunflower oil. °What foods are recommended? ° °· All fresh, frozen, or canned fruits and vegetables. °· Whole grains. °· Low-fat or non-fat (skim) milk and yogurt. °· Lean meat, skinless poultry, fish, eggs, and beans. °· Low-fat protein supplement powders or drinks. °· Spices and herbs. °What foods are not  recommended? °· High-fat foods. These include baked goods, fast food, fatty cuts of meat, ice cream, french toast, sweet rolls, pizza, cheese bread, foods covered with butter, creamy sauces, or cheese. °· Fried foods. These include french fries, tempura, battered fish, breaded chicken, fried breads, and sweets. °· Foods with strong odors. °· Foods that cause bloating and gas. °Summary °· A low-fat diet can be helpful if you have a gallbladder condition, or before and after gallbladder surgery. °· Limit your fat intake to less than 30% of your total daily calories. This is about 60 g of fat if you eat 1,800 calories each day. °· Eat small, frequent meals throughout the day instead of three large meals. °This information is not intended to replace advice given to you by your health care provider. Make sure you discuss any questions you have with your health care provider. °Document Released: 10/25/2013 Document Revised: 02/10/2019 Document Reviewed: 11/27/2016 °Elsevier Patient Education © 2020 [employer] ° °

## 2019-10-19 NOTE — Anesthesia Procedure Notes (Signed)
Procedure Name: Intubation Performed by: West Pugh, CRNA Pre-anesthesia Checklist: Patient identified, Emergency Drugs available, Suction available, Patient being monitored and Timeout performed Patient Re-evaluated:Patient Re-evaluated prior to induction Oxygen Delivery Method: Circle system utilized Preoxygenation: Pre-oxygenation with 100% oxygen Induction Type: IV induction, Rapid sequence and Cricoid Pressure applied Laryngoscope Size: Mac and 3 Grade View: Grade II Tube type: Oral Tube size: 7.0 mm Number of attempts: 1 Airway Equipment and Method: Stylet Placement Confirmation: ETT inserted through vocal cords under direct vision,  positive ETCO2,  CO2 detector and breath sounds checked- equal and bilateral Secured at: 21 cm Tube secured with: Tape Dental Injury: Teeth and Oropharynx as per pre-operative assessment

## 2019-10-19 NOTE — ED Notes (Signed)
Pt transferred to  via [facility]. 

## 2019-10-20 LAB — COMPREHENSIVE METABOLIC PANEL
ALT: 258 U/L — ABNORMAL HIGH (ref 0–44)
AST: 174 U/L — ABNORMAL HIGH (ref 15–41)
Albumin: 3.4 g/dL — ABNORMAL LOW (ref 3.5–5.0)
Alkaline Phosphatase: 188 U/L — ABNORMAL HIGH (ref 38–126)
Anion gap: 11 (ref 5–15)
BUN: 10 mg/dL (ref 8–23)
CO2: 24 mmol/L (ref 22–32)
Calcium: 9.1 mg/dL (ref 8.9–10.3)
Chloride: 103 mmol/L (ref 98–111)
Creatinine, Ser: 0.67 mg/dL (ref 0.44–1.00)
GFR calc Af Amer: >60 mL/min (ref >60)
GFR calc non Af Amer: >60 mL/min (ref >60)
Glucose, Bld: 127 mg/dL — ABNORMAL HIGH (ref 70–99)
Potassium: 4 mmol/L (ref 3.5–5.1)
Sodium: 138 mmol/L (ref 135–145)
Total Bilirubin: 0.7 mg/dL (ref 0.3–1.2)
Total Protein: 6.6 g/dL (ref 6.5–8.1)

## 2019-10-20 LAB — CBC
HCT: 44.4 % (ref 36.0–46.0)
Hemoglobin: 14.5 g/dL (ref 12.0–15.0)
MCH: 29.4 pg (ref 26.0–34.0)
MCHC: 32.7 g/dL (ref 30.0–36.0)
MCV: 90.1 fL (ref 80.0–100.0)
Platelets: 270 10*3/uL (ref 150–400)
RBC: 4.93 MIL/uL (ref 3.87–5.11)
RDW: 12.6 % (ref 11.5–15.5)
WBC: 9 10*3/uL (ref 4.0–10.5)
nRBC: 0 % (ref 0.0–0.2)

## 2019-10-20 LAB — GLUCOSE, CAPILLARY
Glucose-Capillary: 158 mg/dL — ABNORMAL HIGH (ref 70–99)
Glucose-Capillary: 96 mg/dL (ref 70–99)

## 2019-10-20 MED ORDER — OXYCODONE HCL 5 MG PO TABS: 5.0000 mg | ORAL_TABLET | Freq: Four times a day (QID) | ORAL | 0 refills | Status: DC | PRN

## 2019-10-20 MED ORDER — ACETAMINOPHEN 500 MG PO TABS: ORAL_TABLET | ORAL | 0 refills | Status: DC

## 2019-10-20 MED ORDER — IBUPROFEN 200 MG PO TABS: ORAL_TABLET | ORAL | Status: DC

## 2019-10-20 MED ORDER — IBUPROFEN 400 MG PO TABS: 600.0000 mg | ORAL_TABLET | Freq: Four times a day (QID) | ORAL | Status: DC | PRN

## 2019-10-20 NOTE — Progress Notes (Signed)
Assessment unchanged. Pt verbalized understanding of dc instruction through teach back including follow up care and when to call the doctor. Discharged via foot to front entrance accompanied by NT.

## 2019-10-20 NOTE — Progress Notes (Signed)
1 Day Post-Op    CC: Abdominal pain  Subjective: She is doing well this AM.  Feels much better normal soreness.  Port sites all look good.  Objective: Vital signs in last 24 hours: Temp:  [97.6 F (36.4 C)-98.6 F (37 C)] 97.9 F (36.6 C) (12/17 0504) Pulse Rate:  [67-93] 68 (12/17 0504) Resp:  [12-18] 15 (12/17 0504) BP: (112-148)/(69-91) 140/78 (12/17 0504) SpO2:  [95 %-100 %] 95 % (12/17 0504) Weight:  [105.3 kg-106.1 kg] 106.1 kg (12/17 0504) Last BM Date: 10/18/19 Nothing p.o. recorded 2325 IV Urine 1700 Afebrile vital signs are stable Labs are stable. Intake/Output from previous day: 12/16 0701 - 12/17 0700 In: 2325.2 [I.V.:2325.2] Out: 1700 [Urine:1700] Intake/Output this shift: No intake/output data recorded.  General appearance: alert, cooperative and no distress Resp: clear to auscultation bilaterally GI: Soft, sore, port sites all look good.  Lab Results:  Recent Labs    10/19/19 0428 10/20/19 0415  WBC 4.3 9.0  HGB 13.2 14.5  HCT 40.8 44.4  PLT 200 270    BMET Recent Labs    10/19/19 0428 10/20/19 0415  NA 141 138  K 4.0 4.0  CL 106 103  CO2 27 24  GLUCOSE 107* 127*  BUN 14 10  CREATININE 0.76 0.67  CALCIUM 9.0 9.1   PT/INR No results for input(s): LABPROT, INR in the last 72 hours.  Recent Labs  Lab 10/18/19 1742 10/19/19 0428 10/20/19 0415  AST 90* 214* 174*  ALT 61* 182* 258*  ALKPHOS 143* 158* 188*  BILITOT 1.2 0.7 0.7  PROT 7.4 6.5 6.6  ALBUMIN 4.1 3.5 3.4*     Lipase     Component Value Date/Time   LIPASE 44 10/19/2019 0428     Prior to Admission medications   Medication Sig Start Date End Date Taking? Authorizing Provider  Calcium-Phosphorus-Vitamin D (CALCIUM/VITAMIN D3/ADULT GUMMY PO) Take 1 capsule by mouth daily.    Yes [provider]  loratadine (CLARITIN) 10 MG tablet Take 10 mg by mouth daily.   Yes [provider]  Multiple Vitamins-Minerals (MULTI ADULT GUMMIES PO) Take 2 each by  mouth daily.   Yes [provider]  omeprazole (PRILOSEC) 20 MG capsule Take 20 mg by mouth daily.   Yes [provider]  dicyclomine (BENTYL) 20 MG tablet Take 1 tablet (20 mg total) by mouth 2 (two) times daily as needed for spasms. Patient not taking: Reported on 10/19/2019 02/03/17   Quintella Reichert, MD  methocarbamol (ROBAXIN) 500 MG tablet Take 1 tablet (500 mg total) by mouth 4 (four) times daily. Patient not taking: Reported on 10/19/2019 11/27/16   Aundra Dubin, PA-C  ondansetron (ZOFRAN) 4 MG tablet Take 1 tablet (4 mg total) by mouth every 8 (eight) hours as needed for nausea or vomiting. Patient not taking: Reported on 10/19/2019 11/27/16   Aundra Dubin, PA-C  oxyCODONE-acetaminophen (PERCOCET) 5-325 MG tablet Take 1-2 tablets by mouth every 4 (four) hours as needed for severe pain. Patient not taking: Reported on 10/19/2019 11/27/16   Aundra Dubin, PA-C    Medications: . acetaminophen  1,000 mg Oral Q6H  . enoxaparin (LOVENOX) injection  40 mg Subcutaneous Q24H  . gabapentin  200 mg Oral BID  . pantoprazole (PROTONIX) IV  40 mg Intravenous QHS     Assessment/Plan GERD BMI 35.5   Calculus of the gallbladder with acute cholecystitis without mention of obstruction Laparoscopic cholecystectomy with intraoperative cholangiogram 10/19/2019 Dr. Greer Pickerel  FEN: Regular diet  ID: Rocephin 12/15-12/16 DVT: Lovenox Follow-up: [facility]  Plan: advance her diet, have her walk around the halls here this a.m. and place her on p.o. pain medications for pain relief.  I reviewed the discharge instructions in detail with her.  She does well we will plan to discharge her later this AM.    LOS: 1 day    Marlena Barbato 10/20/2019 Please see Amion

## 2019-10-21 LAB — SURGICAL PATHOLOGY

## 2019-10-26 ENCOUNTER — Ambulatory Visit
Admission: RE | Admit: 2019-10-26 | Discharge: 2019-10-26 | Disposition: A | Discharge status: Home / Self Care | Payer: BC Managed Care – PPO | Source: Ambulatory Visit | Attending: Gynecology | Admitting: Gynecology

## 2019-10-26 ENCOUNTER — Other Ambulatory Visit: Payer: Self-pay

## 2019-10-26 ENCOUNTER — Other Ambulatory Visit: Payer: Self-pay | Admitting: Gynecology

## 2019-10-26 ENCOUNTER — Ambulatory Visit
Admission: RE | Admit: 2019-10-26 | Discharge: 2019-10-26 | Disposition: A | Discharge status: Home / Self Care | Payer: 59 | Source: Ambulatory Visit | Attending: Gynecology | Admitting: Gynecology

## 2019-10-26 DIAGNOSIS — N6310 Unspecified lump in the right breast, unspecified quadrant: Secondary | ICD-10-CM

## 2019-11-10 NOTE — Discharge Summary (Signed)
Physician Discharge Summary  Patient ID: Laura Page MRN: [id number] DOB/AGE: Mar 02, 1956 64 y.o.  Admit date: 10/18/2019 Discharge date: 10/19/2020  Admission Diagnoses:  Gallstone pancreatitis GERD  Discharge Diagnoses:  Same  Principal Problem:   Acute cholecystitis Active Problems:   Acute pancreatitis   Cholecystitis   PROCEDURES: *Laparoscopic cholecystectomy with IOC 10/19/2019, Dr. Janalyn Rouse Course:  This is an otherwise healthy, but obese, white female who began having epigastric abdominal pain starting at 1400 yesterday.  This is her fifth episode of similar symptoms but this is the worst so far.  She denies any N/V/D/F/C/CP/OSB/cough, etc.  She has reflux which is otherwise well controlled.  She presented to [facility] and found to have gallstones and some evidence of cholecystitis with small bout of pancreatitis with a lipase in the 300s.  She was transferred to [facility] for [place] to see. Her pain is currently resolved after receiving 2 percocet in the ED. patient was diagnosed with gallstone pancreatitis.  She was admitted and placed on IV fluids, IV antibiotics.  She was taken to the operating room on 10/19/2019, and underwent laparoscopic cholecystectomy with IOC.  She tolerated procedure well return to the floor in satisfactory condition.  The following a.m. she had some mild soreness but felt much better.  He was mobilized placed back on a regular diet and was ready for discharge later that day.  Condition on discharge: Improving  CBC Latest Ref Rng & Units 10/20/2019 10/19/2019 10/18/2019  WBC 4.0 - 10.5 K/uL 9.0 4.3 9.0  Hemoglobin 12.0 - 15.0 g/dL 14.5 13.2 15.0  Hematocrit 36.0 - 46.0 % 44.4 40.8 44.5  Platelets 150 - 400 K/uL 270 200 293   CMP Latest Ref Rng & Units 10/20/2019 10/19/2019 10/18/2019  Glucose 70 - 99 mg/dL 127(H) 107(H) 106(H)  BUN 8 - 23 mg/dL 10 14 16   Creatinine 0.44 - 1.00 mg/dL 0.67 0.76 0.78  Sodium 135 - 145 mmol/L 138 141 140   Potassium 3.5 - 5.1 mmol/L 4.0 4.0 4.4  Chloride 98 - 111 mmol/L 103 106 106  CO2 22 - 32 mmol/L 24 27 27   Calcium 8.9 - 10.3 mg/dL 9.1 9.0 9.6  Total Protein 6.5 - 8.1 g/dL 6.6 6.5 7.4  Total Bilirubin 0.3 - 1.2 mg/dL 0.7 0.7 1.2  Alkaline Phos 38 - 126 U/L 188(H) 158(H) 143(H)  AST 15 - 41 U/L 174(H) 214(H) 90(H)  ALT 0 - 44 U/L 258(H) 182(H) 61(H)   SURGICAL PATHOLOGY  CASE: [id number]  PATIENT: Ethel Rana  Surgical Pathology Report  Clinical History: gallstones, pancreatitis  FINAL MICROSCOPIC DIAGNOSIS:  A. GALLBLADDER, CHOLECYSTECTOMY:  - Chronic cholecystitis with cholelithiasis.    Disposition: Discharge disposition: 01-Home or Self Care        Allergies as of 10/20/2019   No Known Allergies     Medication List    STOP taking these medications   dicyclomine 20 MG tablet Commonly known as: BENTYL   methocarbamol 500 MG tablet Commonly known as: Robaxin   ondansetron 4 MG tablet Commonly known as: Zofran   oxyCODONE-acetaminophen 5-325 MG tablet Commonly known as: Percocet     TAKE these medications   acetaminophen 500 MG tablet Commonly known as: TYLENOL You can take 1000 mg of Tylenol every 8 hours as needed for pain.  For the first 2 to 3 days I would just take it around-the-clock till your pain has improved.  You can add ibuprofen for additional nonnarcotic pain relief.  You can buy this  over-the-counter at any drugstore without a prescription.  Do not take more than 4000 mg of Tylenol/acetaminophen in any 24-hour period; this could harm your liver.   CALCIUM/VITAMIN D3/ADULT GUMMY PO Take 1 capsule by mouth daily.   ibuprofen 200 MG tablet Commonly known as: ADVIL You can take 2 to 3 tablets every 6 hours as needed for pain not relieved by plain Tylenol.  You can alternate the plain Tylenol/acetaminophen with ibuprofen.  You can buy this over-the-counter at any drugstore without a prescription.   loratadine 10 MG tablet Commonly  known as: CLARITIN Take 10 mg by mouth daily.   MULTI ADULT GUMMIES PO Take 2 each by mouth daily.   omeprazole 20 MG capsule Commonly known as: PRILOSEC Take 20 mg by mouth daily.   oxyCODONE 5 MG immediate release tablet Commonly known as: Oxy IR/ROXICODONE Take 1 tablet (5 mg total) by mouth every 6 (six) hours as needed for moderate pain.      Follow-up Information    [facility] Follow up on 11/08/2019.   Specialty: General Surgery Why: Follow up appointment scheduled for 11:45 AM. A provider will call you during scheduled appointment time. Please send a photo of incisions,license, and insurance card to [email or web address] the day prior to appointment.   Contact information: [address] [place] [address] [phone]           Signed: Sherrie George 11/10/2019, 11:16 AM

## 2020-04-26 ENCOUNTER — Ambulatory Visit
Admission: RE | Admit: 2020-04-26 | Discharge: 2020-04-26 | Disposition: A | Discharge status: Home / Self Care | Payer: BC Managed Care – PPO | Source: Ambulatory Visit | Attending: Gynecology | Admitting: Gynecology

## 2020-04-26 ENCOUNTER — Other Ambulatory Visit: Payer: Self-pay

## 2020-04-26 DIAGNOSIS — N6310 Unspecified lump in the right breast, unspecified quadrant: Secondary | ICD-10-CM

## 2022-05-14 DIAGNOSIS — E785 Hyperlipidemia, unspecified: Secondary | ICD-10-CM | POA: Diagnosis not present

## 2022-05-14 DIAGNOSIS — Z0000 Encounter for general adult medical examination without abnormal findings: Secondary | ICD-10-CM | POA: Diagnosis not present

## 2022-05-20 DIAGNOSIS — E785 Hyperlipidemia, unspecified: Secondary | ICD-10-CM | POA: Diagnosis not present

## 2022-05-20 DIAGNOSIS — Z0000 Encounter for general adult medical examination without abnormal findings: Secondary | ICD-10-CM | POA: Diagnosis not present

## 2022-06-25 DIAGNOSIS — M25552 Pain in left hip: Secondary | ICD-10-CM | POA: Diagnosis not present

## 2022-06-25 DIAGNOSIS — M25551 Pain in right hip: Secondary | ICD-10-CM | POA: Diagnosis not present

## 2022-06-25 DIAGNOSIS — M545 Low back pain, unspecified: Secondary | ICD-10-CM | POA: Diagnosis not present

## 2022-06-27 DIAGNOSIS — M25552 Pain in left hip: Secondary | ICD-10-CM | POA: Diagnosis not present

## 2022-08-21 DIAGNOSIS — M25552 Pain in left hip: Secondary | ICD-10-CM | POA: Diagnosis not present

## 2022-08-21 DIAGNOSIS — M25551 Pain in right hip: Secondary | ICD-10-CM | POA: Diagnosis not present

## 2022-08-21 DIAGNOSIS — M5459 Other low back pain: Secondary | ICD-10-CM | POA: Diagnosis not present

## 2022-08-21 DIAGNOSIS — R2689 Other abnormalities of gait and mobility: Secondary | ICD-10-CM | POA: Diagnosis not present

## 2022-08-25 DIAGNOSIS — R2689 Other abnormalities of gait and mobility: Secondary | ICD-10-CM | POA: Diagnosis not present

## 2022-08-25 DIAGNOSIS — M25551 Pain in right hip: Secondary | ICD-10-CM | POA: Diagnosis not present

## 2022-08-25 DIAGNOSIS — M25552 Pain in left hip: Secondary | ICD-10-CM | POA: Diagnosis not present

## 2022-08-25 DIAGNOSIS — M5459 Other low back pain: Secondary | ICD-10-CM | POA: Diagnosis not present

## 2022-08-27 DIAGNOSIS — M25551 Pain in right hip: Secondary | ICD-10-CM | POA: Diagnosis not present

## 2022-08-29 DIAGNOSIS — M5459 Other low back pain: Secondary | ICD-10-CM | POA: Diagnosis not present

## 2022-08-29 DIAGNOSIS — R2689 Other abnormalities of gait and mobility: Secondary | ICD-10-CM | POA: Diagnosis not present

## 2022-08-29 DIAGNOSIS — M25552 Pain in left hip: Secondary | ICD-10-CM | POA: Diagnosis not present

## 2022-08-29 DIAGNOSIS — M25551 Pain in right hip: Secondary | ICD-10-CM | POA: Diagnosis not present

## 2022-09-02 DIAGNOSIS — M5459 Other low back pain: Secondary | ICD-10-CM | POA: Diagnosis not present

## 2022-09-02 DIAGNOSIS — R2689 Other abnormalities of gait and mobility: Secondary | ICD-10-CM | POA: Diagnosis not present

## 2022-09-02 DIAGNOSIS — M25552 Pain in left hip: Secondary | ICD-10-CM | POA: Diagnosis not present

## 2022-09-02 DIAGNOSIS — M25551 Pain in right hip: Secondary | ICD-10-CM | POA: Diagnosis not present

## 2022-09-04 DIAGNOSIS — M5459 Other low back pain: Secondary | ICD-10-CM | POA: Diagnosis not present

## 2022-09-04 DIAGNOSIS — M25551 Pain in right hip: Secondary | ICD-10-CM | POA: Diagnosis not present

## 2022-09-04 DIAGNOSIS — R2689 Other abnormalities of gait and mobility: Secondary | ICD-10-CM | POA: Diagnosis not present

## 2022-09-04 DIAGNOSIS — M25552 Pain in left hip: Secondary | ICD-10-CM | POA: Diagnosis not present

## 2022-09-09 DIAGNOSIS — M5459 Other low back pain: Secondary | ICD-10-CM | POA: Diagnosis not present

## 2022-09-09 DIAGNOSIS — M25552 Pain in left hip: Secondary | ICD-10-CM | POA: Diagnosis not present

## 2022-09-09 DIAGNOSIS — R2689 Other abnormalities of gait and mobility: Secondary | ICD-10-CM | POA: Diagnosis not present

## 2022-09-09 DIAGNOSIS — M25551 Pain in right hip: Secondary | ICD-10-CM | POA: Diagnosis not present

## 2022-09-11 DIAGNOSIS — R2689 Other abnormalities of gait and mobility: Secondary | ICD-10-CM | POA: Diagnosis not present

## 2022-09-11 DIAGNOSIS — M25552 Pain in left hip: Secondary | ICD-10-CM | POA: Diagnosis not present

## 2022-09-11 DIAGNOSIS — M5459 Other low back pain: Secondary | ICD-10-CM | POA: Diagnosis not present

## 2022-09-11 DIAGNOSIS — M25551 Pain in right hip: Secondary | ICD-10-CM | POA: Diagnosis not present

## 2022-09-16 DIAGNOSIS — M25551 Pain in right hip: Secondary | ICD-10-CM | POA: Diagnosis not present

## 2022-09-16 DIAGNOSIS — M25552 Pain in left hip: Secondary | ICD-10-CM | POA: Diagnosis not present

## 2022-09-16 DIAGNOSIS — R2689 Other abnormalities of gait and mobility: Secondary | ICD-10-CM | POA: Diagnosis not present

## 2022-09-16 DIAGNOSIS — M5459 Other low back pain: Secondary | ICD-10-CM | POA: Diagnosis not present

## 2022-09-18 DIAGNOSIS — M25551 Pain in right hip: Secondary | ICD-10-CM | POA: Diagnosis not present

## 2022-09-18 DIAGNOSIS — R2689 Other abnormalities of gait and mobility: Secondary | ICD-10-CM | POA: Diagnosis not present

## 2022-09-18 DIAGNOSIS — M5459 Other low back pain: Secondary | ICD-10-CM | POA: Diagnosis not present

## 2022-09-18 DIAGNOSIS — M25552 Pain in left hip: Secondary | ICD-10-CM | POA: Diagnosis not present

## 2022-09-23 DIAGNOSIS — M5459 Other low back pain: Secondary | ICD-10-CM | POA: Diagnosis not present

## 2022-09-23 DIAGNOSIS — R2689 Other abnormalities of gait and mobility: Secondary | ICD-10-CM | POA: Diagnosis not present

## 2022-09-23 DIAGNOSIS — M25551 Pain in right hip: Secondary | ICD-10-CM | POA: Diagnosis not present

## 2022-09-23 DIAGNOSIS — M25552 Pain in left hip: Secondary | ICD-10-CM | POA: Diagnosis not present

## 2022-09-30 DIAGNOSIS — M25551 Pain in right hip: Secondary | ICD-10-CM | POA: Diagnosis not present

## 2022-09-30 DIAGNOSIS — M25552 Pain in left hip: Secondary | ICD-10-CM | POA: Diagnosis not present

## 2022-09-30 DIAGNOSIS — M5459 Other low back pain: Secondary | ICD-10-CM | POA: Diagnosis not present

## 2022-09-30 DIAGNOSIS — R2689 Other abnormalities of gait and mobility: Secondary | ICD-10-CM | POA: Diagnosis not present

## 2022-10-02 DIAGNOSIS — M25552 Pain in left hip: Secondary | ICD-10-CM | POA: Diagnosis not present

## 2022-10-02 DIAGNOSIS — M25551 Pain in right hip: Secondary | ICD-10-CM | POA: Diagnosis not present

## 2022-10-02 DIAGNOSIS — M5459 Other low back pain: Secondary | ICD-10-CM | POA: Diagnosis not present

## 2022-10-02 DIAGNOSIS — R2689 Other abnormalities of gait and mobility: Secondary | ICD-10-CM | POA: Diagnosis not present

## 2022-10-07 DIAGNOSIS — M25551 Pain in right hip: Secondary | ICD-10-CM | POA: Diagnosis not present

## 2022-10-07 DIAGNOSIS — R2689 Other abnormalities of gait and mobility: Secondary | ICD-10-CM | POA: Diagnosis not present

## 2022-10-07 DIAGNOSIS — M25552 Pain in left hip: Secondary | ICD-10-CM | POA: Diagnosis not present

## 2022-10-07 DIAGNOSIS — M5459 Other low back pain: Secondary | ICD-10-CM | POA: Diagnosis not present

## 2022-10-09 DIAGNOSIS — R2689 Other abnormalities of gait and mobility: Secondary | ICD-10-CM | POA: Diagnosis not present

## 2022-10-09 DIAGNOSIS — M25552 Pain in left hip: Secondary | ICD-10-CM | POA: Diagnosis not present

## 2022-10-09 DIAGNOSIS — M25551 Pain in right hip: Secondary | ICD-10-CM | POA: Diagnosis not present

## 2022-10-09 DIAGNOSIS — M5459 Other low back pain: Secondary | ICD-10-CM | POA: Diagnosis not present

## 2022-10-14 DIAGNOSIS — M25552 Pain in left hip: Secondary | ICD-10-CM | POA: Diagnosis not present

## 2022-10-14 DIAGNOSIS — R2689 Other abnormalities of gait and mobility: Secondary | ICD-10-CM | POA: Diagnosis not present

## 2022-10-14 DIAGNOSIS — M25551 Pain in right hip: Secondary | ICD-10-CM | POA: Diagnosis not present

## 2022-10-14 DIAGNOSIS — M5459 Other low back pain: Secondary | ICD-10-CM | POA: Diagnosis not present

## 2022-10-15 DIAGNOSIS — M7061 Trochanteric bursitis, right hip: Secondary | ICD-10-CM | POA: Diagnosis not present

## 2022-10-15 DIAGNOSIS — M25552 Pain in left hip: Secondary | ICD-10-CM | POA: Diagnosis not present

## 2022-10-15 DIAGNOSIS — M545 Low back pain, unspecified: Secondary | ICD-10-CM | POA: Diagnosis not present

## 2022-10-21 DIAGNOSIS — M5459 Other low back pain: Secondary | ICD-10-CM | POA: Diagnosis not present

## 2022-10-21 DIAGNOSIS — M25551 Pain in right hip: Secondary | ICD-10-CM | POA: Diagnosis not present

## 2022-10-21 DIAGNOSIS — M25552 Pain in left hip: Secondary | ICD-10-CM | POA: Diagnosis not present

## 2022-10-21 DIAGNOSIS — R2689 Other abnormalities of gait and mobility: Secondary | ICD-10-CM | POA: Diagnosis not present

## 2022-10-22 DIAGNOSIS — M7061 Trochanteric bursitis, right hip: Secondary | ICD-10-CM | POA: Diagnosis not present

## 2022-10-23 DIAGNOSIS — M25552 Pain in left hip: Secondary | ICD-10-CM | POA: Diagnosis not present

## 2022-10-23 DIAGNOSIS — R2689 Other abnormalities of gait and mobility: Secondary | ICD-10-CM | POA: Diagnosis not present

## 2022-10-23 DIAGNOSIS — M25551 Pain in right hip: Secondary | ICD-10-CM | POA: Diagnosis not present

## 2022-10-23 DIAGNOSIS — M5459 Other low back pain: Secondary | ICD-10-CM | POA: Diagnosis not present

## 2022-11-04 DIAGNOSIS — M25551 Pain in right hip: Secondary | ICD-10-CM | POA: Diagnosis not present

## 2022-11-04 DIAGNOSIS — M5459 Other low back pain: Secondary | ICD-10-CM | POA: Diagnosis not present

## 2022-11-04 DIAGNOSIS — R2689 Other abnormalities of gait and mobility: Secondary | ICD-10-CM | POA: Diagnosis not present

## 2022-11-04 DIAGNOSIS — M25552 Pain in left hip: Secondary | ICD-10-CM | POA: Diagnosis not present

## 2022-11-06 DIAGNOSIS — M25551 Pain in right hip: Secondary | ICD-10-CM | POA: Diagnosis not present

## 2022-11-06 DIAGNOSIS — M25552 Pain in left hip: Secondary | ICD-10-CM | POA: Diagnosis not present

## 2022-11-06 DIAGNOSIS — R2689 Other abnormalities of gait and mobility: Secondary | ICD-10-CM | POA: Diagnosis not present

## 2022-11-06 DIAGNOSIS — M5459 Other low back pain: Secondary | ICD-10-CM | POA: Diagnosis not present

## 2022-11-14 DIAGNOSIS — M25552 Pain in left hip: Secondary | ICD-10-CM | POA: Diagnosis not present

## 2022-11-20 DIAGNOSIS — M25551 Pain in right hip: Secondary | ICD-10-CM | POA: Diagnosis not present

## 2022-11-20 DIAGNOSIS — R2689 Other abnormalities of gait and mobility: Secondary | ICD-10-CM | POA: Diagnosis not present

## 2022-11-20 DIAGNOSIS — M25552 Pain in left hip: Secondary | ICD-10-CM | POA: Diagnosis not present

## 2022-11-20 DIAGNOSIS — M5459 Other low back pain: Secondary | ICD-10-CM | POA: Diagnosis not present

## 2022-12-08 DIAGNOSIS — R197 Diarrhea, unspecified: Secondary | ICD-10-CM | POA: Diagnosis not present

## 2022-12-17 DIAGNOSIS — R197 Diarrhea, unspecified: Secondary | ICD-10-CM | POA: Diagnosis not present

## 2023-01-05 ENCOUNTER — Emergency Department (HOSPITAL_BASED_OUTPATIENT_CLINIC_OR_DEPARTMENT_OTHER): Payer: Worker's Compensation

## 2023-01-05 ENCOUNTER — Encounter (HOSPITAL_BASED_OUTPATIENT_CLINIC_OR_DEPARTMENT_OTHER): Payer: Self-pay | Admitting: Urology

## 2023-01-05 ENCOUNTER — Emergency Department (HOSPITAL_BASED_OUTPATIENT_CLINIC_OR_DEPARTMENT_OTHER)
Admission: EM | Admit: 2023-01-05 | Discharge: 2023-01-05 | Disposition: A | Discharge status: Home / Self Care | Payer: Worker's Compensation | Attending: Emergency Medicine | Admitting: Emergency Medicine

## 2023-01-05 ENCOUNTER — Other Ambulatory Visit: Payer: Self-pay

## 2023-01-05 DIAGNOSIS — S0031XA Abrasion of nose, initial encounter: Secondary | ICD-10-CM | POA: Insufficient documentation

## 2023-01-05 DIAGNOSIS — S0990XA Unspecified injury of head, initial encounter: Secondary | ICD-10-CM | POA: Diagnosis not present

## 2023-01-05 DIAGNOSIS — M25561 Pain in right knee: Secondary | ICD-10-CM | POA: Diagnosis not present

## 2023-01-05 DIAGNOSIS — M79602 Pain in left arm: Secondary | ICD-10-CM | POA: Diagnosis not present

## 2023-01-05 DIAGNOSIS — S0992XA Unspecified injury of nose, initial encounter: Secondary | ICD-10-CM | POA: Diagnosis present

## 2023-01-05 DIAGNOSIS — W19XXXA Unspecified fall, initial encounter: Secondary | ICD-10-CM | POA: Diagnosis not present

## 2023-01-05 MED ORDER — ACETAMINOPHEN 325 MG PO TABS
650.0000 mg | ORAL_TABLET | Freq: Once | ORAL | Status: AC
  Administered 2023-01-05: 650 mg via ORAL
  Filled 2023-01-05: qty 2

## 2023-01-05 NOTE — ED Notes (Signed)
Pt sitting up in bed, pt states that she is ready to go home, pt verbalized understanding d/c and follow up, pt ambulatory from dpt.

## 2023-01-05 NOTE — ED Provider Notes (Signed)
[facility] Provider Note   CSN: [id number] Arrival date & time: 01/05/23  1437     History  Chief Complaint  Patient presents with   Lytle Michaels    Laura Page is a 67 y.o. female.  HPI 67 year old female presents after a fall.  She states her left foot caught on something causing her to fall forward.  She did not lose consciousness but hit the front of her face.  She states her maxillary incisors are sore but not loose.  She has had some nasal pain and had a transient nosebleed.  Also has a mild headache that she feels like is getting worse.  She feels like her right knee is sore though that is better.  Her left arm muscle feels sore.  She is not on blood thinners.  She was able to ambulate without difficulty on her right knee.  Home Medications Prior to Admission medications   Medication Sig Start Date End Date Taking? Authorizing Provider  acetaminophen (TYLENOL) 500 MG tablet You can take 1000 mg of Tylenol every 8 hours as needed for pain.  For the first 2 to 3 days I would just take it around-the-clock till your pain has improved.  You can add ibuprofen for additional nonnarcotic pain relief.  You can buy this over-the-counter at any drugstore without a prescription.  Do not take more than 4000 mg of Tylenol/acetaminophen in any 24-hour period; this could harm your liver. 10/20/19   Laura Regal, PA-C  Calcium-Phosphorus-Vitamin D (CALCIUM/VITAMIN D3/ADULT GUMMY PO) Take 1 capsule by mouth daily.     [provider]  ibuprofen (ADVIL) 200 MG tablet You can take 2 to 3 tablets every 6 hours as needed for pain not relieved by plain Tylenol.  You can alternate the plain Tylenol/acetaminophen with ibuprofen.  You can buy this over-the-counter at any drugstore without a prescription. 10/20/19   Laura Regal, PA-C  loratadine (CLARITIN) 10 MG tablet Take 10 mg by mouth daily.    [provider]  Multiple  Vitamins-Minerals (MULTI ADULT GUMMIES PO) Take 2 each by mouth daily.    [provider]  omeprazole (PRILOSEC) 20 MG capsule Take 20 mg by mouth daily.    [provider]  oxyCODONE (OXY IR/ROXICODONE) 5 MG immediate release tablet Take 1 tablet (5 mg total) by mouth every 6 (six) hours as needed for moderate pain. 10/20/19   Laura Regal, PA-C      Allergies    Patient has no known allergies.    Review of Systems   Review of Systems  HENT:  Positive for facial swelling and nosebleeds.   Musculoskeletal:  Positive for arthralgias and neck pain.  Neurological:  Positive for headaches.    Physical Exam Updated Vital Signs BP (!) 174/89 (BP Location: Left Arm)   Pulse 79   Temp 98.1 F (36.7 C)   Resp 20   Ht '5\' 8"'$  (1.727 m)   Wt 113.4 kg   SpO2 100%   BMI 38.01 kg/m  Physical Exam Vitals and nursing note reviewed.  Constitutional:      Appearance: She is well-developed.  HENT:     Head: Normocephalic.     Nose: Signs of injury present.      Comments: Dried blood at both nares but no active bleeding    Mouth/Throat:     Comments: Her upper middle teeth are a little sore to the touch but no loosening.  No bleeding. Eyes:  Extraocular Movements: Extraocular movements intact.     Pupils: Pupils are equal, round, and reactive to light.  Cardiovascular:     Rate and Rhythm: Normal rate and regular rhythm.     Pulses:          Posterior tibial pulses are 2+ on the right side.  Pulmonary:     Effort: Pulmonary effort is normal.  Musculoskeletal:     Comments: Mild muscular soreness in her left triceps.  No bony tenderness.  No decreased range of motion. Right knee is mildly tender on the medial aspect.  Able to flex/extend without difficulty.  She currently has pants on and does not want to remove them for a look at the skin at this time.  Skin:    General: Skin is warm and dry.  Neurological:     Mental Status: She is alert.     Comments: Equal  strength in all 4 extremities.  A little bit of soreness limits the left upper extremity strength testing but she feels like it is equal and not weaker than normal.     ED Results / Procedures / Treatments   Labs (all labs ordered are listed, but only abnormal results are displayed) Labs Reviewed - No data to display  EKG None  Radiology CT Head Wo Contrast  Result Date: 01/05/2023 CLINICAL DATA:  Head trauma, minor (Age >= 65y); Facial trauma, blunt; Neck trauma (Age >= 65y) EXAM: CT HEAD WITHOUT CONTRAST CT MAXILLOFACIAL WITHOUT CONTRAST CT CERVICAL SPINE WITHOUT CONTRAST TECHNIQUE: Multidetector CT imaging of the head, cervical spine, and maxillofacial structures were performed using the standard protocol without intravenous contrast. Multiplanar CT image reconstructions of the cervical spine and maxillofacial structures were also generated. RADIATION DOSE REDUCTION: This exam was performed according to the departmental dose-optimization program which includes automated exposure control, adjustment of the mA and/or kV according to patient size and/or use of iterative reconstruction technique. COMPARISON:  09/29/2015 FINDINGS: CT HEAD FINDINGS Brain: No evidence of acute infarction, hemorrhage, hydrocephalus, extra-axial collection or mass lesion/mass effect. Vascular: No hyperdense vessel or unexpected calcification. Skull: Normal. Negative for fracture or focal lesion. Other: Negative for scalp hematoma. CT MAXILLOFACIAL FINDINGS Osseous: No acute maxillofacial bone fracture. Leftward angulation of the bilateral nasal bones and rightward deviation of the nasal septum is similar in appearance to the previous MRI, when accounting for differences in technique. Bony orbital walls are intact. Mandible intact. Temporomandibular joints are aligned without dislocation. Orbits: Negative. No traumatic or inflammatory finding. Sinuses: Clear. Soft tissues: No focal soft tissue swelling or hematoma. CT  CERVICAL SPINE FINDINGS Alignment: Facet joints are aligned without dislocation or traumatic listhesis. Dens and lateral masses are aligned. Skull base and vertebrae: No acute fracture. No primary bone lesion or focal pathologic process. Soft tissues and spinal canal: No prevertebral fluid or swelling. No visible canal hematoma. Disc levels: Moderate multilevel degenerative disc disease and facet arthropathy throughout the cervical spine. Upper chest: Negative. Other: None. IMPRESSION: 1. No acute intracranial abnormality. 2. No acute maxillofacial bone fracture. 3. No acute fracture or subluxation of the cervical spine. Electronically Signed   By: Davina Poke D.O.   On: 01/05/2023 15:21   CT Cervical Spine Wo Contrast  Result Date: 01/05/2023 CLINICAL DATA:  Head trauma, minor (Age >= 65y); Facial trauma, blunt; Neck trauma (Age >= 65y) EXAM: CT HEAD WITHOUT CONTRAST CT MAXILLOFACIAL WITHOUT CONTRAST CT CERVICAL SPINE WITHOUT CONTRAST TECHNIQUE: Multidetector CT imaging of the head, cervical spine, and maxillofacial structures were performed  using the standard protocol without intravenous contrast. Multiplanar CT image reconstructions of the cervical spine and maxillofacial structures were also generated. RADIATION DOSE REDUCTION: This exam was performed according to the departmental dose-optimization program which includes automated exposure control, adjustment of the mA and/or kV according to patient size and/or use of iterative reconstruction technique. COMPARISON:  09/29/2015 FINDINGS: CT HEAD FINDINGS Brain: No evidence of acute infarction, hemorrhage, hydrocephalus, extra-axial collection or mass lesion/mass effect. Vascular: No hyperdense vessel or unexpected calcification. Skull: Normal. Negative for fracture or focal lesion. Other: Negative for scalp hematoma. CT MAXILLOFACIAL FINDINGS Osseous: No acute maxillofacial bone fracture. Leftward angulation of the bilateral nasal bones and rightward  deviation of the nasal septum is similar in appearance to the previous MRI, when accounting for differences in technique. Bony orbital walls are intact. Mandible intact. Temporomandibular joints are aligned without dislocation. Orbits: Negative. No traumatic or inflammatory finding. Sinuses: Clear. Soft tissues: No focal soft tissue swelling or hematoma. CT CERVICAL SPINE FINDINGS Alignment: Facet joints are aligned without dislocation or traumatic listhesis. Dens and lateral masses are aligned. Skull base and vertebrae: No acute fracture. No primary bone lesion or focal pathologic process. Soft tissues and spinal canal: No prevertebral fluid or swelling. No visible canal hematoma. Disc levels: Moderate multilevel degenerative disc disease and facet arthropathy throughout the cervical spine. Upper chest: Negative. Other: None. IMPRESSION: 1. No acute intracranial abnormality. 2. No acute maxillofacial bone fracture. 3. No acute fracture or subluxation of the cervical spine. Electronically Signed   By: Davina Poke D.O.   On: 01/05/2023 15:21   CT Maxillofacial WO CM  Result Date: 01/05/2023 CLINICAL DATA:  Head trauma, minor (Age >= 65y); Facial trauma, blunt; Neck trauma (Age >= 65y) EXAM: CT HEAD WITHOUT CONTRAST CT MAXILLOFACIAL WITHOUT CONTRAST CT CERVICAL SPINE WITHOUT CONTRAST TECHNIQUE: Multidetector CT imaging of the head, cervical spine, and maxillofacial structures were performed using the standard protocol without intravenous contrast. Multiplanar CT image reconstructions of the cervical spine and maxillofacial structures were also generated. RADIATION DOSE REDUCTION: This exam was performed according to the departmental dose-optimization program which includes automated exposure control, adjustment of the mA and/or kV according to patient size and/or use of iterative reconstruction technique. COMPARISON:  09/29/2015 FINDINGS: CT HEAD FINDINGS Brain: No evidence of acute infarction, hemorrhage,  hydrocephalus, extra-axial collection or mass lesion/mass effect. Vascular: No hyperdense vessel or unexpected calcification. Skull: Normal. Negative for fracture or focal lesion. Other: Negative for scalp hematoma. CT MAXILLOFACIAL FINDINGS Osseous: No acute maxillofacial bone fracture. Leftward angulation of the bilateral nasal bones and rightward deviation of the nasal septum is similar in appearance to the previous MRI, when accounting for differences in technique. Bony orbital walls are intact. Mandible intact. Temporomandibular joints are aligned without dislocation. Orbits: Negative. No traumatic or inflammatory finding. Sinuses: Clear. Soft tissues: No focal soft tissue swelling or hematoma. CT CERVICAL SPINE FINDINGS Alignment: Facet joints are aligned without dislocation or traumatic listhesis. Dens and lateral masses are aligned. Skull base and vertebrae: No acute fracture. No primary bone lesion or focal pathologic process. Soft tissues and spinal canal: No prevertebral fluid or swelling. No visible canal hematoma. Disc levels: Moderate multilevel degenerative disc disease and facet arthropathy throughout the cervical spine. Upper chest: Negative. Other: None. IMPRESSION: 1. No acute intracranial abnormality. 2. No acute maxillofacial bone fracture. 3. No acute fracture or subluxation of the cervical spine. Electronically Signed   By: Davina Poke D.O.   On: 01/05/2023 15:21    Procedures Procedures  Medications Ordered in ED Medications  acetaminophen (TYLENOL) tablet 650 mg (has no administration in time range)    ED Course/ Medical Decision Making/ A&P                             Medical Decision Making Amount and/or Complexity of Data Reviewed Radiology: independent interpretation performed.    Details: CT head without head bleed, no cervical spine fracture and no facial fracture on the other CTs.  Risk OTC drugs.   Patient presents after a fall after her left foot got  stuck.  No obvious fractures or serious injury.  I discussed we could do an x-ray for her right knee but it seems like this is more just sore and she declines.  Otherwise, no dental loosening or other major concern.  No current nosebleed.  Will give her some Tylenol and an ice pack and she otherwise appears stable for discharge home with return precautions.        Final Clinical Impression(s) / ED Diagnoses Final diagnoses:  Fall, initial encounter  Minor head injury, initial encounter  Abrasion of nose, initial encounter    Rx / DC Orders ED Discharge Orders     None         Sherwood Gambler, MD 01/05/23 [phone]

## 2023-01-05 NOTE — Discharge Instructions (Signed)
You may take Tylenol and/or ibuprofen to help with pain.  Apply some antibiotic ointment and ice to your nose.  If you notice new or worsening headache, vomiting, confusion, weakness or numbness, or any other new/concerning symptoms then return to the ER or call 911.

## 2023-01-05 NOTE — ED Notes (Signed)
Ice pack given,

## 2023-01-05 NOTE — ED Triage Notes (Signed)
Pt states mechanical fall on concrete floor at 1250,  Facial abrasion on nose, hit teeth   Right knee injury as well   NO blood thinners, hit head but no LOC

## 2023-01-06 DIAGNOSIS — K219 Gastro-esophageal reflux disease without esophagitis: Secondary | ICD-10-CM | POA: Diagnosis not present

## 2023-01-06 DIAGNOSIS — R194 Change in bowel habit: Secondary | ICD-10-CM | POA: Diagnosis not present

## 2023-02-06 DIAGNOSIS — R194 Change in bowel habit: Secondary | ICD-10-CM | POA: Diagnosis not present

## 2023-02-06 DIAGNOSIS — Z9049 Acquired absence of other specified parts of digestive tract: Secondary | ICD-10-CM | POA: Diagnosis not present

## 2023-02-06 DIAGNOSIS — R195 Other fecal abnormalities: Secondary | ICD-10-CM | POA: Diagnosis not present

## 2023-02-06 DIAGNOSIS — K219 Gastro-esophageal reflux disease without esophagitis: Secondary | ICD-10-CM | POA: Diagnosis not present

## 2023-03-13 DIAGNOSIS — R12 Heartburn: Secondary | ICD-10-CM | POA: Diagnosis not present

## 2023-03-13 DIAGNOSIS — R194 Change in bowel habit: Secondary | ICD-10-CM | POA: Diagnosis not present

## 2023-03-13 DIAGNOSIS — K5730 Diverticulosis of large intestine without perforation or abscess without bleeding: Secondary | ICD-10-CM | POA: Diagnosis not present

## 2023-03-13 DIAGNOSIS — K317 Polyp of stomach and duodenum: Secondary | ICD-10-CM | POA: Diagnosis not present

## 2023-03-13 DIAGNOSIS — K449 Diaphragmatic hernia without obstruction or gangrene: Secondary | ICD-10-CM | POA: Diagnosis not present

## 2023-03-17 DIAGNOSIS — K317 Polyp of stomach and duodenum: Secondary | ICD-10-CM | POA: Diagnosis not present

## 2023-03-17 DIAGNOSIS — K5730 Diverticulosis of large intestine without perforation or abscess without bleeding: Secondary | ICD-10-CM | POA: Diagnosis not present

## 2023-03-17 DIAGNOSIS — R194 Change in bowel habit: Secondary | ICD-10-CM | POA: Diagnosis not present

## 2023-04-20 ENCOUNTER — Other Ambulatory Visit: Payer: Self-pay | Admitting: Gynecology

## 2023-04-20 DIAGNOSIS — Z1231 Encounter for screening mammogram for malignant neoplasm of breast: Secondary | ICD-10-CM

## 2023-04-23 ENCOUNTER — Ambulatory Visit
Admission: RE | Admit: 2023-04-23 | Discharge: 2023-04-23 | Disposition: A | Discharge status: Home / Self Care | Payer: Medicare HMO | Source: Ambulatory Visit | Attending: Gynecology | Admitting: Gynecology

## 2023-04-23 DIAGNOSIS — Z1231 Encounter for screening mammogram for malignant neoplasm of breast: Secondary | ICD-10-CM | POA: Diagnosis not present

## 2023-05-14 DIAGNOSIS — M25552 Pain in left hip: Secondary | ICD-10-CM | POA: Diagnosis not present

## 2023-06-05 DIAGNOSIS — Z1211 Encounter for screening for malignant neoplasm of colon: Secondary | ICD-10-CM | POA: Diagnosis not present

## 2023-06-05 DIAGNOSIS — R413 Other amnesia: Secondary | ICD-10-CM | POA: Diagnosis not present

## 2023-06-05 DIAGNOSIS — Z6837 Body mass index (BMI) 37.0-37.9, adult: Secondary | ICD-10-CM | POA: Diagnosis not present

## 2023-06-05 DIAGNOSIS — Z0000 Encounter for general adult medical examination without abnormal findings: Secondary | ICD-10-CM | POA: Diagnosis not present

## 2023-06-05 DIAGNOSIS — Z1231 Encounter for screening mammogram for malignant neoplasm of breast: Secondary | ICD-10-CM | POA: Diagnosis not present

## 2023-06-05 DIAGNOSIS — E785 Hyperlipidemia, unspecified: Secondary | ICD-10-CM | POA: Diagnosis not present

## 2023-06-05 DIAGNOSIS — Z13820 Encounter for screening for osteoporosis: Secondary | ICD-10-CM | POA: Diagnosis not present

## 2023-06-16 ENCOUNTER — Other Ambulatory Visit: Payer: Self-pay | Admitting: Family Medicine

## 2023-06-16 DIAGNOSIS — Z13820 Encounter for screening for osteoporosis: Secondary | ICD-10-CM

## 2023-06-16 DIAGNOSIS — Z1231 Encounter for screening mammogram for malignant neoplasm of breast: Secondary | ICD-10-CM

## 2023-09-11 DIAGNOSIS — M25552 Pain in left hip: Secondary | ICD-10-CM | POA: Diagnosis not present

## 2023-09-14 DIAGNOSIS — Z01818 Encounter for other preprocedural examination: Secondary | ICD-10-CM | POA: Diagnosis not present

## 2023-09-14 DIAGNOSIS — M1612 Unilateral primary osteoarthritis, left hip: Secondary | ICD-10-CM | POA: Diagnosis not present

## 2023-09-14 DIAGNOSIS — Z9181 History of falling: Secondary | ICD-10-CM | POA: Diagnosis not present

## 2023-11-03 NOTE — Progress Notes (Signed)
Sent message, via epic in basket, requesting orders in epic from surgeon.  

## 2023-11-06 DIAGNOSIS — M25552 Pain in left hip: Secondary | ICD-10-CM | POA: Diagnosis not present

## 2023-11-10 NOTE — Patient Instructions (Signed)
 SURGICAL WAITING ROOM VISITATION Patients having surgery or a procedure may have no more than 2 support people in the waiting area - these visitors may rotate in the visitor waiting room.   Due to an increase in RSV and influenza rates and associated hospitalizations, children ages 72 and under may not visit patients in [facility] hospitals. If the patient needs to stay at the hospital during part of their recovery, the visitor guidelines for inpatient rooms apply.  PRE-OP VISITATION  Pre-op nurse will coordinate an appropriate time for 1 support person to accompany the patient in pre-op.  This support person may not rotate.  This visitor will be contacted when the time is appropriate for the visitor to come back in the pre-op area.  Please refer to the [facility] website for the visitor guidelines for Inpatients (after your surgery is over and you are in a regular room).  You are not required to quarantine at this time prior to your surgery. However, you must do this: Hand Hygiene often Do NOT share personal items Notify your provider if you are in close contact with someone who has COVID or you develop fever 100.4 or greater, new onset of sneezing, cough, sore throat, shortness of breath or body aches.  If you test positive for Covid or have been in contact with anyone that has tested positive in the last 10 days please notify you surgeon.    Your procedure is scheduled on:  TUESDAY   November 24, 2023  Report to [facility] Main Entrance: [address] entrance where the [employer] is available.   Report to admitting at: 05:15    AM  Call this number if you have any questions or problems the morning of surgery [phone]  Do not eat food after Midnight the night prior to your surgery/procedure.  After Midnight you may have the following liquids until  04:30 AM DAY OF SURGERY  Clear Liquid Diet Water  Black Coffee (sugar ok, NO MILK/CREAM OR CREAMERS)  Tea (sugar ok, NO  MILK/CREAM OR CREAMERS) regular and decaf                             Plain Jell-O  with no fruit (NO RED)                                           Fruit ices (not with fruit pulp, NO RED)                                     Popsicles (NO RED)                                                                  Juice: NO CITRUS JUICES: only apple, WHITE grape, WHITE cranberry Sports drinks like Gatorade or Powerade (NO RED)                   The day of surgery:  Drink ONE (1) Pre-Surgery Clear Ensure at 04:30      AM  the morning of surgery. Drink in one sitting. Do not sip.  This drink was given to you during your hospital pre-op appointment visit. Nothing else to drink after completing the Pre-Surgery Clear Ensure: No candy, chewing gum or throat lozenges.    FOLLOW ANY ADDITIONAL PRE OP INSTRUCTIONS YOU RECEIVED FROM YOUR SURGEON'S OFFICE!!!   Oral Hygiene is also important to reduce your risk of infection.        Remember - BRUSH YOUR TEETH THE MORNING OF SURGERY WITH YOUR REGULAR TOOTHPASTE  Do NOT smoke after Midnight the night before surgery.  STOP TAKING all Vitamins, Herbs and supplements 1 week before your surgery.   Take ONLY these medicines the morning of surgery with A SIP OF WATER : omeprazole                   You may not have any metal on your body including hair pins, jewelry, and body piercing  Do not wear make-up, lotions, powders, perfumes  or deodorant  Do not wear nail polish including gel and S&S, artificial / acrylic nails, or any other type of covering on natural nails including finger and toenails. If you have artificial nails, gel coating, etc., that needs to be removed by a nail salon, Please have this removed prior to surgery. Not doing so may mean that your surgery could be cancelled or delayed if the Surgeon or anesthesia staff feels like they are unable to monitor you safely.   Do not shave 48 hours prior to surgery to avoid nicks in your skin which may  contribute to postoperative infections.   Contacts, Hearing Aids, dentures or bridgework may not be worn into surgery. DENTURES WILL BE REMOVED PRIOR TO SURGERY PLEASE DO NOT APPLY Poly grip OR ADHESIVES!!!  Patients discharged on the day of surgery will not be allowed to drive home.  Someone NEEDS to stay with you for the first 24 hours after anesthesia.  Do not bring your home medications to the hospital. The Pharmacy will dispense medications listed on your medication list to you during your admission in the Hospital.  Special Instructions: Bring a copy of your healthcare power of attorney and living will documents the day of surgery, if you wish to have them scanned into your [facility]- EPIC  Please read over the following fact sheets you were given: IF YOU HAVE QUESTIONS ABOUT YOUR PRE-OP INSTRUCTIONS, PLEASE CALL [phone].     Pre-operative 5 CHG Bath Instructions   You can play a key role in reducing the risk of infection after surgery. Your skin needs to be as free of germs as possible. You can reduce the number of germs on your skin by washing with CHG (chlorhexidine  gluconate) soap before surgery. CHG is an antiseptic soap that kills germs and continues to kill germs even after washing.   DO NOT use if you have an allergy to chlorhexidine /CHG or antibacterial soaps. If your skin becomes reddened or irritated, stop using the CHG and notify one of our RNs at [phone]  Please shower with the CHG soap starting 4 days before surgery using the following schedule: START SHOWERS ON    FRIDAY  November 20, 2023  Please keep in mind the following:  DO NOT shave, including legs and underarms, starting the day of your first shower.   You may shave your face at any point before/day of surgery.    Place clean sheets on your bed the day you start using CHG soap. Use a clean washcloth (not used since being washed) for each shower. DO NOT sleep with pets once you start using the CHG.   CHG Shower Instructions:  If you choose to wash your hair and private area, wash first with your normal shampoo/soap.  After you use shampoo/soap, rinse your hair and body thoroughly to remove shampoo/soap residue.  Turn the water  OFF and apply about 3 tablespoons (45 ml) of CHG soap to a CLEAN washcloth.  Apply CHG soap ONLY FROM YOUR NECK DOWN TO YOUR TOES (washing for 3-5 minutes)  DO NOT use CHG soap on face, private areas, open wounds, or sores.  Pay special attention to the area where your surgery is being performed.  If you are having back surgery, having someone wash your back for you may be helpful.  Wait 2 minutes after CHG soap is applied, then you may rinse off the CHG soap.  Pat dry with a clean towel  Put on clean clothes/pajamas   If you choose to wear lotion, please use ONLY the CHG-compatible lotions on the back of this paper.     Additional instructions for the day of surgery: DO NOT APPLY any lotions, deodorants, cologne, or perfumes.   Put on clean/comfortable clothes.  Brush your teeth.  Ask your nurse before applying any prescription medications to the skin.      CHG Compatible Lotions   Aveeno Moisturizing lotion  Cetaphil Moisturizing Cream  Cetaphil Moisturizing Lotion  Clairol Herbal Essence Moisturizing Lotion, Dry Skin  Clairol Herbal Essence Moisturizing Lotion, Extra Dry Skin  Clairol Herbal Essence Moisturizing Lotion, Normal Skin  Curel Age Defying Therapeutic Moisturizing Lotion with Alpha Hydroxy  Curel Extreme Care Body Lotion  Curel Soothing Hands Moisturizing Hand Lotion  Curel Therapeutic Moisturizing Cream, Fragrance-Free  Curel Therapeutic Moisturizing Lotion, Fragrance-Free  Curel Therapeutic Moisturizing Lotion, Original Formula  Eucerin Daily  Replenishing Lotion  Eucerin Dry Skin Therapy Plus Alpha Hydroxy Crme  Eucerin Dry Skin Therapy Plus Alpha Hydroxy Lotion  Eucerin Original Crme  Eucerin Original Lotion  Eucerin Plus Crme Eucerin Plus Lotion  Eucerin TriLipid Replenishing Lotion  Keri Anti-Bacterial Hand Lotion  Keri Deep Conditioning Original Lotion Dry Skin Formula Softly Scented  Keri Deep Conditioning Original Lotion, Fragrance Free Sensitive Skin Formula  Keri Lotion Fast Absorbing Fragrance Free Sensitive Skin Formula  Keri Lotion Fast Absorbing Softly Scented Dry Skin Formula  Keri Original Lotion  Keri Skin Renewal Lotion Keri Silky Smooth Lotion  Keri Silky Smooth Sensitive Skin Lotion  Nivea Body Creamy Conditioning Oil  Nivea Body Extra Enriched Lotion  Nivea Body Original Lotion  Nivea Body Sheer Moisturizing Lotion Nivea Crme  Nivea Skin Firming Lotion  NutraDerm 30 Skin Lotion  NutraDerm Skin Lotion  NutraDerm Therapeutic Skin Cream  NutraDerm Therapeutic Skin Lotion  ProShield Protective Hand Cream  Provon moisturizing lotion   FAILURE TO FOLLOW THESE INSTRUCTIONS MAY RESULT IN THE CANCELLATION OF YOUR SURGERY  PATIENT SIGNATURE_________________________________  NURSE SIGNATURE__________________________________  ________________________________________________________________________     Laura Page    An incentive spirometer is a tool that can help keep your lungs clear and active. This tool measures how well you are filling your lungs with each breath. Taking long deep  breaths may help reverse or decrease the chance of developing breathing (pulmonary) problems (especially infection) following: A long period of time when you are unable to move or be active. BEFORE THE PROCEDURE  If the spirometer includes an indicator to show your best effort, your nurse or respiratory therapist will set it to a desired goal. If possible, sit up straight or lean slightly forward. Try not to  slouch. Hold the incentive spirometer in an upright position. INSTRUCTIONS FOR USE  Sit on the edge of your bed if possible, or sit up as far as you can in bed or on a chair. Hold the incentive spirometer in an upright position. Breathe out normally. Place the mouthpiece in your mouth and seal your lips tightly around it. Breathe in slowly and as deeply as possible, raising the piston or the ball toward the top of the column. Hold your breath for 3-5 seconds or for as long as possible. Allow the piston or ball to fall to the bottom of the column. Remove the mouthpiece from your mouth and breathe out normally. Rest for a few seconds and repeat Steps 1 through 7 at least 10 times every 1-2 hours when you are awake. Take your time and take a few normal breaths between deep breaths. The spirometer may include an indicator to show your best effort. Use the indicator as a goal to work toward during each repetition. After each set of 10 deep breaths, practice coughing to be sure your lungs are clear. If you have an incision (the cut made at the time of surgery), support your incision when coughing by placing a pillow or rolled up towels firmly against it. Once you are able to get out of bed, walk around indoors and cough well. You may stop using the incentive spirometer when instructed by your caregiver.  RISKS AND COMPLICATIONS Take your time so you do not get dizzy or light-headed. If you are in pain, you may need to take or ask for pain medication before doing incentive spirometry. It is harder to take a deep breath if you are having pain. AFTER USE Rest and breathe slowly and easily. It can be helpful to keep track of a log of your progress. Your caregiver can provide you with a simple table to help with this. If you are using the spirometer at home, follow these instructions: SEEK MEDICAL CARE IF:  You are having difficultly using the spirometer. You have trouble using the spirometer as often as  instructed. Your pain medication is not giving enough relief while using the spirometer. You develop fever of 100.5 F (38.1 C) or higher.                                                                                                    SEEK IMMEDIATE MEDICAL CARE IF:  You cough up bloody sputum that had not been present before. You develop fever of 102 F (38.9 C) or greater. You develop worsening pain at or near the incision site. MAKE SURE YOU:  Understand these instructions. Will watch your condition. Will get  help right away if you are not doing well or get worse. Document Released: 03/02/2007 Document Revised: 01/12/2012 Document Reviewed: 05/03/2007 [facility] Patient Information 2014 [place], [place].      WHAT IS A BLOOD TRANSFUSION? Blood Transfusion Information  A transfusion is the replacement of blood or some of its parts. Blood is made up of multiple cells which provide different functions. Red blood cells carry oxygen and are used for blood loss replacement. White blood cells fight against infection. Platelets control bleeding. Plasma helps clot blood. Other blood products are available for specialized needs, such as hemophilia or other clotting disorders. BEFORE THE TRANSFUSION  Who gives blood for transfusions?  Healthy volunteers who are fully evaluated to make sure their blood is safe. This is blood bank blood. Transfusion therapy is the safest it has ever been in the practice of medicine. Before blood is taken from a donor, a complete history is taken to make sure that person has no history of diseases nor engages in risky social behavior (examples are intravenous drug use or sexual activity with multiple partners). The donor's travel history is screened to minimize risk of transmitting infections, such as malaria. The donated blood is tested for signs of infectious diseases, such as HIV and hepatitis. The blood is then tested to be sure it is compatible with you in  order to minimize the chance of a transfusion reaction. If you or a relative donates blood, this is often done in anticipation of surgery and is not appropriate for emergency situations. It takes many days to process the donated blood. RISKS AND COMPLICATIONS Although transfusion therapy is very safe and saves many lives, the main dangers of transfusion include:  Getting an infectious disease. Developing a transfusion reaction. This is an allergic reaction to something in the blood you were given. Every precaution is taken to prevent this. The decision to have a blood transfusion has been considered carefully by your caregiver before blood is given. Blood is not given unless the benefits outweigh the risks. AFTER THE TRANSFUSION Right after receiving a blood transfusion, you will usually feel much better and more energetic. This is especially true if your red blood cells have gotten low (anemic). The transfusion raises the level of the red blood cells which carry oxygen, and this usually causes an energy increase. The nurse administering the transfusion will monitor you carefully for complications. HOME CARE INSTRUCTIONS  No special instructions are needed after a transfusion. You may find your energy is better. Speak with your caregiver about any limitations on activity for underlying diseases you may have. SEEK MEDICAL CARE IF:  Your condition is not improving after your transfusion. You develop redness or irritation at the intravenous (IV) site. SEEK IMMEDIATE MEDICAL CARE IF:  Any of the following symptoms occur over the next 12 hours: Shaking chills. You have a temperature by mouth above 102 F (38.9 C), not controlled by medicine. Chest, back, or muscle pain. People around you feel you are not acting correctly or are confused. Shortness of breath or difficulty breathing. Dizziness and fainting. You get a rash or develop hives. You have a decrease in urine output. Your urine turns a  dark color or changes to pink, red, or brown. Any of the following symptoms occur over the next 10 days: You have a temperature by mouth above 102 F (38.9 C), not controlled by medicine. Shortness of breath. Weakness after normal activity. The white part of the eye turns yellow (jaundice). You have a decrease in  the amount of urine or are urinating less often. Your urine turns a dark color or changes to pink, red, or brown. Document Released: 10/17/2000 Document Revised: 01/12/2012 Document Reviewed: 06/05/2008 [facility] Patient Information 2014 [place], [place].  _______________________________________________________________________

## 2023-11-10 NOTE — Progress Notes (Addendum)
 COVID Vaccine received:  []  No [x]  Yes Date of any COVID positive Test in last 90 days: None  PCP - Chiquita Maize FNP at [facility]  [phone]  Cardiologist - none  Chest x-ray - 10-18-2019  2v  Epic EKG -  10-19-2019  Epic    no hx to warrant repeat.  Stress Test -  ECHO -  Cardiac Cath -   PCR screen: [x]  Ordered & Completed []   No Order but Needs PROFEND     []   N/A for this surgery  Surgery Plan:  [x]  Ambulatory   []  Outpatient in bed  []  Admit Anesthesia:    []  General  [x]  Spinal  []   Choice []   MAC  Pacemaker / ICD device [x]  No []  Yes   Spinal Cord Stimulator:[x]  No []  Yes       History of Sleep Apnea? [x]  No []  Yes   CPAP used?- [x]  No []  Yes    Does the patient monitor blood sugar?   [x]  N/A   []  No []  Yes  Patient has: [x]  NO Hx DM   []  Pre-DM   []  DM1  []   DM2  Blood Thinner / Instructions:   None Aspirin  Instructions:  none  ERAS Protocol Ordered: []  No  [x]  Yes PRE-SURGERY [x]  ENSURE  []  G2   Patient is to be NPO after: 0430  Dental hx: []  Dentures:  [x]  N/A      []  Bridge or Partial:                   []  Loose or Damaged teeth:   Comments: Patient was given the 5 CHG shower / bath instructions for THA surgery along with 2 bottles of the CHG soap. Patient will start this on:  11-20-2023.   All questions were asked and answered, Patient voiced understanding of this process.   Activity level: Patient is able to climb a flight of stairs without difficulty; [x]  No CP  [x]  No SOB, but would have Leg pain.  Patient can perform ADLs without assistance.   Anesthesia review: GERD, Hiatal Hernia, HLD  Patient denies shortness of breath, fever, cough and chest pain at PAT appointment.  Patient verbalized understanding and agreement to the Pre-Surgical Instructions that were given to them at this PAT appointment. Patient was also educated of the need to review these PAT instructions again prior to her surgery.I reviewed the appropriate phone numbers to call if  they have any and questions or concerns.

## 2023-11-11 ENCOUNTER — Encounter (HOSPITAL_COMMUNITY): Payer: Self-pay

## 2023-11-11 ENCOUNTER — Other Ambulatory Visit: Payer: Self-pay

## 2023-11-11 ENCOUNTER — Encounter (HOSPITAL_COMMUNITY)
Admission: RE | Admit: 2023-11-11 | Discharge: 2023-11-11 | Disposition: A | Discharge status: Home / Self Care | Payer: Medicare HMO | Source: Ambulatory Visit | Attending: Orthopedic Surgery | Admitting: Orthopedic Surgery

## 2023-11-11 VITALS — BP 130/78 | HR 90 | Temp 98.8°F | Resp 16 | Ht 67.0 in | Wt 238.0 lb

## 2023-11-11 DIAGNOSIS — Z01812 Encounter for preprocedural laboratory examination: Secondary | ICD-10-CM | POA: Insufficient documentation

## 2023-11-11 DIAGNOSIS — E785 Hyperlipidemia, unspecified: Secondary | ICD-10-CM | POA: Diagnosis not present

## 2023-11-11 DIAGNOSIS — Z01818 Encounter for other preprocedural examination: Secondary | ICD-10-CM

## 2023-11-11 HISTORY — DX: Personal history of other diseases of the digestive system: Z87.19

## 2023-11-11 LAB — CBC
HCT: 43.6 % (ref 36.0–46.0)
Hemoglobin: 14.4 g/dL (ref 12.0–15.0)
MCH: 29.4 pg (ref 26.0–34.0)
MCHC: 33 g/dL (ref 30.0–36.0)
MCV: 89.2 fL (ref 80.0–100.0)
Platelets: 312 10*3/uL (ref 150–400)
RBC: 4.89 MIL/uL (ref 3.87–5.11)
RDW: 12.7 % (ref 11.5–15.5)
WBC: 6.7 10*3/uL (ref 4.0–10.5)
nRBC: 0 % (ref 0.0–0.2)

## 2023-11-11 LAB — BASIC METABOLIC PANEL
Anion gap: 9 (ref 5–15)
BUN: 18 mg/dL (ref 8–23)
CO2: 26 mmol/L (ref 22–32)
Calcium: 9.6 mg/dL (ref 8.9–10.3)
Chloride: 106 mmol/L (ref 98–111)
Creatinine, Ser: 0.69 mg/dL (ref 0.44–1.00)
GFR, Estimated: >60 mL/min (ref >60)
Glucose, Bld: 129 mg/dL — ABNORMAL HIGH (ref 70–99)
Potassium: 4.2 mmol/L (ref 3.5–5.1)
Sodium: 141 mmol/L (ref 135–145)

## 2023-11-11 LAB — TYPE AND SCREEN
ABO/RH(D): O POS
Antibody Screen: NEGATIVE

## 2023-11-11 LAB — SURGICAL PCR SCREEN
MRSA, PCR: NEGATIVE
Staphylococcus aureus: NEGATIVE

## 2023-11-16 NOTE — H&P (Signed)
 HIP ARTHROPLASTY ADMISSION H&P  Patient ID: Laura Page MRN: [id number] DOB/AGE: Jan 01, 1956 68 y.o.  Chief Complaint: left hip pain.  Planned Procedure Date: 11/24/23 Medical and Cardiac Clearance by Chiquita Maize NP     HPI: Laura Page is a 68 y.o. female who presents for evaluation of OA LEFT HIP. The patient has a history of pain and functional disability in the left hip due to arthritis and has failed non-surgical conservative treatments for greater than 12 weeks to include NSAID's and/or analgesics, corticosteriod injections, and activity modification.  Onset of symptoms was gradual, starting 2 years ago with gradually worsening course since that time. The patient noted no past surgery on the left hip.  Patient currently rates pain at 9 out of 10 with activity. Patient has night pain, worsening of pain with activity and weight bearing, and pain that interferes with activities of daily living.  Patient has evidence of subchondral sclerosis, periarticular osteophytes, and joint space narrowing by imaging studies.  There is no active infection.  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   Past Surgical History:  Procedure Laterality Date   BILATERAL SALPINGOOPHORECTOMY  12/17/2009   CHOLECYSTECTOMY N/A 10/19/2019   Procedure: LAPAROSCOPIC CHOLECYSTECTOMY WITH INTRAOPERATIVE CHOLANGIOGRAM;  Surgeon: Tanda Locus, MD;  Location: [facility];  Service: General;  Laterality: N/A;   HYSTEROSCOPY W/ ENDOMETRIAL ABLATION  06/09/2008   SUPRACERVICAL ABDOMINAL HYSTERECTOMY  12/17/2009   TOTAL SHOULDER ARTHROPLASTY Left 11/27/2016   Procedure: TOTAL SHOULDER ARTHROPLASTY;  Surgeon: Toribio JULIANNA Chancy, MD;  Location: [facility];  Service: Orthopedics;  Laterality: Left;   No Known Allergies Prior to Admission  medications   Medication Sig Start Date End Date Taking? Authorizing Provider  Apoaequorin (PREVAGEN PO) Take 1 tablet by mouth in the morning.   Yes [provider]  Bioflavonoid Products (VITAMIN C) CHEW Chew 2 tablets by mouth in the morning. Vitafusion Power C Gummies   Yes [provider]  Calcium-Phosphorus-Vitamin D (CALCIUM GUMMIES PO) Take 1 Dose by mouth in the morning. Vitafusion Calcium Supplement Gummy   Yes [provider]  ibuprofen  (ADVIL ) 200 MG tablet Take 200 mg by mouth daily as needed (pain.).   Yes [provider]  Multiple Vitamin (MULTIVITAMIN WITH MINERALS) TABS tablet Take 1 tablet by mouth in the morning. Centrum Silver Multivitamin Women 50+   Yes [provider]  omeprazole (PRILOSEC) 20 MG capsule Take 20 mg by mouth daily before breakfast.   Yes [provider]   Social History   Socioeconomic History   Marital status: Married    Spouse name: Not on file   Number of children: Not on file   Years of education: Not on file   Highest education level: Not on file  Occupational History   Not on file  Tobacco Use   Smoking status: Former    Current packs/day: 0.00    Types: Cigarettes    Quit date: 11/02/2006    Years since quitting: 17.0   Smokeless tobacco: Never  Vaping Use   Vaping status: Never Used  Substance and Sexual Activity   Alcohol use: No   Drug use: No   Sexual activity: Yes  Other Topics Concern   Not on file  Social History Narrative   Not on file  Social Drivers of Corporate Investment Banker Strain: Not on file  Food Insecurity: Not on file  Transportation Needs: Not on file  Physical Activity: Not on file  Stress: Not on file  Social Connections: Not on file   Family History  Problem Relation Age of Onset   Cancer Mother    Breast cancer Mother 55   Stroke Father     ROS: Currently denies lightheadedness, dizziness, Fever, chills, CP, SOB.   No personal history of  DVT, PE, MI, or CVA. No loose teeth or dentures All other systems have been reviewed and were otherwise currently negative with the exception of those mentioned in the HPI and as above.  Objective: Vitals: Ht: 5'6 Wt: 241.7 lbs Temp: 97.7 BP: 133/88 Pulse: 83 O2 96% on room air.   Physical Exam: General: Alert, NAD. Trendelenberg Gait  HEENT: EOMI, Good Neck Extension  Pulm: No increased work of breathing.  Clear B/L A/P w/o crackle or wheeze.  CV: RRR, No m/g/r appreciated  GI: soft, NT, ND. BS x 4 quadrants Neuro: CN II-XII grossly intact without focal deficit.  Sensation intact distally Skin: No lesions in the area of chief complaint MSK/Surgical Site: + TTP. Hip ROM decreased due to pain. GLENWOOD Haymaker. - SLR. + FABER/FADIR. Decreased strength.  NVI.    Imaging Review Plain radiographs demonstrate severe degenerative joint disease of the left hip.   The bone quality appears to be fair for age and reported activity level.  Preoperative templating of the joint replacement has been completed, documented, and submitted to the Operating Room personnel in order to optimize intra-operative equipment management.  Assessment: OA LEFT HIP Active Problems:   * No active hospital problems. *   Plan: Plan for Procedure(s): TOTAL HIP ARTHROPLASTY ANTERIOR APPROACH  The patient history, physical exam, clinical judgement of the provider and imaging are consistent with end stage degenerative joint disease and total joint arthroplasty is deemed medically necessary. The treatment options including medical management, injection therapy, and arthroplasty were discussed at length. The risks and benefits of Procedure(s): TOTAL HIP ARTHROPLASTY ANTERIOR APPROACH were presented and reviewed.  The risks of nonoperative treatment, versus surgical intervention including but not limited to continued pain, aseptic loosening, stiffness, dislocation/subluxation, infection, bleeding, nerve injury, blood  clots, cardiopulmonary complications, morbidity, mortality, among others were discussed. The patient verbalizes understanding and wishes to proceed with the plan.  Patient is being admitted for surgery, pain control, PT, prophylactic antibiotics, VTE prophylaxis, progressive ambulation, ADL's and discharge planning.  Dental prophylaxis discussed and recommended for 2 years postoperatively.  The patient does meet the criteria for TXA which will be used perioperatively.   ASA 81 mg BID will be used postoperatively for DVT prophylaxis in addition to SCDs, and early ambulation. Plan for Oxycodone , Mobic , Tylenol  for pain.   Robaxin  for muscle spasm.  Zofran  for nausea and vomiting. Senokot is for constipation prevention. Pharmacy- [facility] The patient is planning to be discharged home with OPPT and into the care of her husband Addie who can be reached at [phone] Follow up appt 12/09/23 at 4:15pm     Gerard CHRISTELLA Ted DEVONNA Office [phone] 11/16/2023 12:19 PM

## 2023-11-20 NOTE — Care Plan (Signed)
Ortho Bundle Case Management Note  Patient Details  Name: Laura Page MRN: [id number] Date of Birth: [birth year]  patient seen in the office for H&P. will discharge to home with family to assist. rolling walker ordered. OPPT set up with [facility] PT. discharge instructions discussed and questions answered                   DME Arranged:  Walker rolling DME Agency:  [employer]  HH Arranged:    HH Agency:     Additional Comments: Please contact me with any questions of if this plan should need to change.  Shauna Hugh,  RN,BSN,MHA,CCM  [facility] Orthopaedic Specialist  [phone] 11/20/2023, 8:22 AM

## 2023-11-23 NOTE — Anesthesia Preprocedure Evaluation (Signed)
Anesthesia Evaluation  Patient identified by MRN, date of birth, ID band Patient awake    Reviewed: Allergy & Precautions, NPO status , Patient's Chart, lab work & pertinent test results  Airway Mallampati: II  TM Distance: >3 FB Neck ROM: Full    Dental no notable dental hx. (+) Teeth Intact, Dental Advisory Given   Pulmonary former smoker   Pulmonary exam normal breath sounds clear to auscultation       Cardiovascular Exercise Tolerance: Good Normal cardiovascular exam Rhythm:Regular Rate:Normal     Neuro/Psych negative neurological ROS     GI/Hepatic Neg liver ROS,GERD  Medicated and Controlled,,  Endo/Other  negative endocrine ROS    Renal/GU Lab Results      Component                Value               Date                          K                        4.2                 11/11/2023               CREATININE               0.69                11/11/2023                         Musculoskeletal  (+) Arthritis , Osteoarthritis,    Abdominal   Peds  Hematology Lab Results      Component                Value               Date                      WBC                      6.7                 11/11/2023                HGB                      14.4                11/11/2023                HCT                      43.6                11/11/2023                MCV                      89.2                11/11/2023                PLT  312                 11/11/2023              Anesthesia Other Findings   Reproductive/Obstetrics                             Anesthesia Physical Anesthesia Plan  ASA: 2  Anesthesia Plan: Spinal   Post-op Pain Management: Minimal or no pain anticipated   Induction:   PONV Risk Score and Plan: Treatment may vary due to age or medical condition, Midazolam, Ondansetron and Propofol infusion  Airway Management Planned: Natural Airway  and Simple Face Mask  Additional Equipment: None  Intra-op Plan:   Post-operative Plan:   Informed Consent: I have reviewed the patients History and Physical, chart, labs and discussed the procedure including the risks, benefits and alternatives for the proposed anesthesia with the patient or authorized representative who has indicated his/her understanding and acceptance.     Dental advisory given  Plan Discussed with: CRNA and Anesthesiologist  Anesthesia Plan Comments:         Anesthesia Quick Evaluation

## 2023-11-24 ENCOUNTER — Encounter (HOSPITAL_COMMUNITY)
Admission: RE | Disposition: A | Discharge status: Home / Self Care | Payer: Self-pay | Source: Ambulatory Visit | Attending: Orthopedic Surgery

## 2023-11-24 ENCOUNTER — Encounter (HOSPITAL_COMMUNITY): Payer: Self-pay | Admitting: Orthopedic Surgery

## 2023-11-24 ENCOUNTER — Ambulatory Visit (HOSPITAL_BASED_OUTPATIENT_CLINIC_OR_DEPARTMENT_OTHER): Payer: Self-pay | Admitting: Anesthesiology

## 2023-11-24 ENCOUNTER — Ambulatory Visit (HOSPITAL_COMMUNITY): Payer: Medicare HMO

## 2023-11-24 ENCOUNTER — Ambulatory Visit (HOSPITAL_COMMUNITY)
Admission: RE | Admit: 2023-11-24 | Discharge: 2023-11-24 | Disposition: A | Discharge status: Home / Self Care | Payer: Medicare HMO | Source: Ambulatory Visit | Attending: Orthopedic Surgery | Admitting: Orthopedic Surgery

## 2023-11-24 ENCOUNTER — Other Ambulatory Visit: Payer: Self-pay

## 2023-11-24 ENCOUNTER — Ambulatory Visit (HOSPITAL_COMMUNITY): Payer: Self-pay | Admitting: Anesthesiology

## 2023-11-24 DIAGNOSIS — K219 Gastro-esophageal reflux disease without esophagitis: Secondary | ICD-10-CM | POA: Insufficient documentation

## 2023-11-24 DIAGNOSIS — M1612 Unilateral primary osteoarthritis, left hip: Secondary | ICD-10-CM | POA: Insufficient documentation

## 2023-11-24 DIAGNOSIS — Z87891 Personal history of nicotine dependence: Secondary | ICD-10-CM | POA: Insufficient documentation

## 2023-11-24 DIAGNOSIS — Z79899 Other long term (current) drug therapy: Secondary | ICD-10-CM | POA: Insufficient documentation

## 2023-11-24 DIAGNOSIS — Z96642 Presence of left artificial hip joint: Secondary | ICD-10-CM

## 2023-11-24 HISTORY — PX: TOTAL HIP ARTHROPLASTY: SHX124

## 2023-11-24 LAB — ABO/RH: ABO/RH(D): O POS

## 2023-11-24 SURGERY — ARTHROPLASTY, HIP, TOTAL, ANTERIOR APPROACH
Anesthesia: Spinal | Site: Hip | Laterality: Left

## 2023-11-24 MED ORDER — ONDANSETRON HCL 4 MG/2ML IJ SOLN
INTRAMUSCULAR | Status: DC | PRN
  Administered 2023-11-24: 4 mg via INTRAVENOUS

## 2023-11-24 MED ORDER — METHOCARBAMOL 500 MG PO TABS
ORAL_TABLET | ORAL | Status: AC
  Administered 2023-11-24: 500 mg via ORAL
  Filled 2023-11-24: qty 1

## 2023-11-24 MED ORDER — LIDOCAINE HCL (PF) 2 % IJ SOLN
INTRAMUSCULAR | Status: AC
  Filled 2023-11-24: qty 5

## 2023-11-24 MED ORDER — LACTATED RINGERS IV BOLUS
250.0000 mL | Freq: Once | INTRAVENOUS | Status: DC
Start: 2023-11-24 — End: 2023-11-24

## 2023-11-24 MED ORDER — OXYCODONE HCL 5 MG PO TABS: 5.0000 mg | ORAL_TABLET | Freq: Once | ORAL | Status: DC | PRN

## 2023-11-24 MED ORDER — ORAL CARE MOUTH RINSE: 15.0000 mL | Freq: Once | OROMUCOSAL | Status: AC

## 2023-11-24 MED ORDER — BUPIVACAINE LIPOSOME 1.3 % IJ SUSP
INTRAMUSCULAR | Status: DC | PRN
  Administered 2023-11-24: 10 mL

## 2023-11-24 MED ORDER — METHOCARBAMOL 1000 MG/10ML IJ SOLN: 500.0000 mg | Freq: Four times a day (QID) | INTRAMUSCULAR | Status: DC | PRN

## 2023-11-24 MED ORDER — METHOCARBAMOL 750 MG PO TABS: 750.0000 mg | ORAL_TABLET | Freq: Three times a day (TID) | ORAL | 0 refills | Status: DC | PRN

## 2023-11-24 MED ORDER — ACETAMINOPHEN 325 MG PO TABS: 325.0000 mg | ORAL_TABLET | Freq: Four times a day (QID) | ORAL | Status: DC | PRN

## 2023-11-24 MED ORDER — LACTATED RINGERS IV SOLN: INTRAVENOUS | Status: DC

## 2023-11-24 MED ORDER — PHENYLEPHRINE HCL (PRESSORS) 10 MG/ML IV SOLN
INTRAVENOUS | Status: DC | PRN
  Administered 2023-11-24 (×2): 160 ug via INTRAVENOUS
  Administered 2023-11-24: 80 ug via INTRAVENOUS

## 2023-11-24 MED ORDER — ACETAMINOPHEN 500 MG PO TABS: 1000.0000 mg | ORAL_TABLET | Freq: Four times a day (QID) | ORAL | 0 refills | Status: AC | PRN

## 2023-11-24 MED ORDER — ONDANSETRON HCL 4 MG/2ML IJ SOLN
INTRAMUSCULAR | Status: AC
  Administered 2023-11-24: 4 mg via INTRAVENOUS
  Filled 2023-11-24: qty 2

## 2023-11-24 MED ORDER — AMISULPRIDE (ANTIEMETIC) 5 MG/2ML IV SOLN: 10.0000 mg | Freq: Once | INTRAVENOUS | Status: DC | PRN

## 2023-11-24 MED ORDER — LACTATED RINGERS IV BOLUS
500.0000 mL | Freq: Once | INTRAVENOUS | Status: AC
  Administered 2023-11-24: 500 mL via INTRAVENOUS

## 2023-11-24 MED ORDER — CEFAZOLIN SODIUM-DEXTROSE 2-4 GM/100ML-% IV SOLN
2.0000 g | INTRAVENOUS | Status: AC
  Administered 2023-11-24: 2 g via INTRAVENOUS
  Filled 2023-11-24: qty 100

## 2023-11-24 MED ORDER — LIDOCAINE HCL (CARDIAC) PF 100 MG/5ML IV SOSY
PREFILLED_SYRINGE | INTRAVENOUS | Status: DC | PRN
  Administered 2023-11-24: 20 mg via INTRATRACHEAL

## 2023-11-24 MED ORDER — OXYCODONE HCL 5 MG/5ML PO SOLN: 5.0000 mg | Freq: Once | ORAL | Status: DC | PRN

## 2023-11-24 MED ORDER — CHLORHEXIDINE GLUCONATE 0.12 % MT SOLN
15.0000 mL | Freq: Once | OROMUCOSAL | Status: AC
  Administered 2023-11-24: 15 mL via OROMUCOSAL

## 2023-11-24 MED ORDER — PROPOFOL 10 MG/ML IV BOLUS
INTRAVENOUS | Status: DC | PRN
  Administered 2023-11-24: 25 mg via INTRAVENOUS
  Administered 2023-11-24: 30 mg via INTRAVENOUS
  Administered 2023-11-24: 20 mg via INTRAVENOUS
  Administered 2023-11-24: 25 mg via INTRAVENOUS
  Administered 2023-11-24: 20 mg via INTRAVENOUS
  Administered 2023-11-24: 5 mg via INTRAVENOUS
  Administered 2023-11-24: 15 mg via INTRAVENOUS
  Administered 2023-11-24 (×2): 20 mg via INTRAVENOUS

## 2023-11-24 MED ORDER — CEFAZOLIN SODIUM-DEXTROSE 2-4 GM/100ML-% IV SOLN
INTRAVENOUS | Status: AC
  Administered 2023-11-24: 2000 mg via INTRAVENOUS
  Filled 2023-11-24: qty 100

## 2023-11-24 MED ORDER — TRANEXAMIC ACID-NACL 1000-0.7 MG/100ML-% IV SOLN
1000.0000 mg | Freq: Once | INTRAVENOUS | Status: DC
Start: 2023-11-24 — End: 2023-11-24

## 2023-11-24 MED ORDER — TRANEXAMIC ACID-NACL 1000-0.7 MG/100ML-% IV SOLN
1000.0000 mg | INTRAVENOUS | Status: AC
  Administered 2023-11-24: 1000 mg via INTRAVENOUS
  Filled 2023-11-24: qty 100

## 2023-11-24 MED ORDER — POVIDONE-IODINE 10 % EX SWAB: 2.0000 | Freq: Once | CUTANEOUS | Status: DC

## 2023-11-24 MED ORDER — BUPIVACAINE LIPOSOME 1.3 % IJ SUSP: 10.0000 mL | Freq: Once | INTRAMUSCULAR | Status: DC

## 2023-11-24 MED ORDER — SODIUM CHLORIDE (PF) 0.9 % IJ SOLN
INTRAMUSCULAR | Status: AC
Start: 2023-11-24 — End: ?
  Filled 2023-11-24: qty 10

## 2023-11-24 MED ORDER — BUPIVACAINE IN DEXTROSE 0.75-8.25 % IT SOLN
INTRATHECAL | Status: DC | PRN
  Administered 2023-11-24: 12.75 mg via INTRATHECAL

## 2023-11-24 MED ORDER — DEXAMETHASONE SODIUM PHOSPHATE 10 MG/ML IJ SOLN
INTRAMUSCULAR | Status: AC
  Filled 2023-11-24: qty 1

## 2023-11-24 MED ORDER — ACETAMINOPHEN 500 MG PO TABS
1000.0000 mg | ORAL_TABLET | Freq: Once | ORAL | Status: AC
  Administered 2023-11-24: 1000 mg via ORAL
  Filled 2023-11-24: qty 2

## 2023-11-24 MED ORDER — PROPOFOL 1000 MG/100ML IV EMUL
INTRAVENOUS | Status: AC
  Filled 2023-11-24: qty 100

## 2023-11-24 MED ORDER — OXYCODONE HCL 5 MG PO TABS: 5.0000 mg | ORAL_TABLET | ORAL | Status: DC | PRN

## 2023-11-24 MED ORDER — MIDAZOLAM HCL 5 MG/5ML IJ SOLN
INTRAMUSCULAR | Status: DC | PRN
  Administered 2023-11-24: 2 mg via INTRAVENOUS

## 2023-11-24 MED ORDER — PROPOFOL 500 MG/50ML IV EMUL
INTRAVENOUS | Status: DC | PRN
  Administered 2023-11-24: 50 ug/kg/min via INTRAVENOUS

## 2023-11-24 MED ORDER — HYDROMORPHONE HCL 1 MG/ML IJ SOLN
0.2500 mg | INTRAMUSCULAR | Status: DC | PRN
Start: 2023-11-24 — End: 2023-11-24

## 2023-11-24 MED ORDER — ACETAMINOPHEN 500 MG PO TABS: 1000.0000 mg | ORAL_TABLET | Freq: Four times a day (QID) | ORAL | Status: DC

## 2023-11-24 MED ORDER — METOCLOPRAMIDE HCL 5 MG PO TABS: 5.0000 mg | ORAL_TABLET | Freq: Three times a day (TID) | ORAL | Status: DC | PRN

## 2023-11-24 MED ORDER — HYDROMORPHONE HCL 1 MG/ML IJ SOLN: 0.5000 mg | INTRAMUSCULAR | Status: DC | PRN

## 2023-11-24 MED ORDER — WATER FOR IRRIGATION, STERILE IR SOLN
Status: DC | PRN
  Administered 2023-11-24: 1000 mL

## 2023-11-24 MED ORDER — CEFAZOLIN SODIUM-DEXTROSE 2-4 GM/100ML-% IV SOLN: 2.0000 g | Freq: Four times a day (QID) | INTRAVENOUS | Status: AC

## 2023-11-24 MED ORDER — OXYCODONE HCL 5 MG PO TABS
ORAL_TABLET | ORAL | Status: AC
  Administered 2023-11-24: 10 mg via ORAL
  Filled 2023-11-24: qty 2

## 2023-11-24 MED ORDER — ONDANSETRON HCL 4 MG PO TABS
4.0000 mg | ORAL_TABLET | Freq: Four times a day (QID) | ORAL | Status: DC | PRN
Start: 2023-11-24 — End: 2023-11-24

## 2023-11-24 MED ORDER — ONDANSETRON HCL 4 MG/2ML IJ SOLN
INTRAMUSCULAR | Status: AC
  Filled 2023-11-24: qty 2

## 2023-11-24 MED ORDER — OXYCODONE HCL 5 MG PO TABS
10.0000 mg | ORAL_TABLET | ORAL | Status: DC | PRN
Start: 2023-11-24 — End: 2023-11-24

## 2023-11-24 MED ORDER — SENNA-DOCUSATE SODIUM 8.6-50 MG PO TABS: 2.0000 | ORAL_TABLET | Freq: Every day | ORAL | 1 refills | Status: DC | PRN

## 2023-11-24 MED ORDER — SODIUM CHLORIDE FLUSH 0.9 % IV SOLN
INTRAVENOUS | Status: DC | PRN
  Administered 2023-11-24: 10 mL

## 2023-11-24 MED ORDER — OXYCODONE HCL 5 MG PO TABS: 5.0000 mg | ORAL_TABLET | ORAL | 0 refills | Status: DC | PRN

## 2023-11-24 MED ORDER — METHOCARBAMOL 500 MG PO TABS: 500.0000 mg | ORAL_TABLET | Freq: Four times a day (QID) | ORAL | Status: DC | PRN

## 2023-11-24 MED ORDER — MELOXICAM 15 MG PO TABS: 15.0000 mg | ORAL_TABLET | Freq: Every day | ORAL | 0 refills | Status: DC | PRN

## 2023-11-24 MED ORDER — DEXAMETHASONE SODIUM PHOSPHATE 10 MG/ML IJ SOLN
8.0000 mg | Freq: Once | INTRAMUSCULAR | Status: AC
  Administered 2023-11-24: 8 mg via INTRAVENOUS

## 2023-11-24 MED ORDER — ASPIRIN 81 MG PO TBEC: 81.0000 mg | DELAYED_RELEASE_TABLET | Freq: Two times a day (BID) | ORAL | 0 refills | Status: AC

## 2023-11-24 MED ORDER — MIDAZOLAM HCL 2 MG/2ML IJ SOLN
INTRAMUSCULAR | Status: AC
  Filled 2023-11-24: qty 2

## 2023-11-24 MED ORDER — BUPIVACAINE LIPOSOME 1.3 % IJ SUSP
INTRAMUSCULAR | Status: AC
  Filled 2023-11-24: qty 10

## 2023-11-24 MED ORDER — ONDANSETRON HCL 4 MG/2ML IJ SOLN: 4.0000 mg | Freq: Four times a day (QID) | INTRAMUSCULAR | Status: DC | PRN

## 2023-11-24 MED ORDER — ACETAMINOPHEN 10 MG/ML IV SOLN: 1000.0000 mg | Freq: Once | INTRAVENOUS | Status: DC | PRN

## 2023-11-24 MED ORDER — 0.9 % SODIUM CHLORIDE (POUR BTL) OPTIME
TOPICAL | Status: DC | PRN
  Administered 2023-11-24: 1000 mL

## 2023-11-24 MED ORDER — ONDANSETRON HCL 4 MG/2ML IJ SOLN: 4.0000 mg | Freq: Once | INTRAMUSCULAR | Status: DC | PRN

## 2023-11-24 MED ORDER — FENTANYL CITRATE (PF) 100 MCG/2ML IJ SOLN
INTRAMUSCULAR | Status: AC
  Filled 2023-11-24: qty 2

## 2023-11-24 MED ORDER — METOCLOPRAMIDE HCL 5 MG/ML IJ SOLN: 5.0000 mg | Freq: Three times a day (TID) | INTRAMUSCULAR | Status: DC | PRN

## 2023-11-24 MED ORDER — ONDANSETRON 4 MG PO TBDP: 4.0000 mg | ORAL_TABLET | Freq: Three times a day (TID) | ORAL | 0 refills | Status: DC | PRN

## 2023-11-24 SURGICAL SUPPLY — 41 items
BAG COUNTER SPONGE SURGICOUNT (BAG) IMPLANT
BAG ZIPLOCK 12X15 (MISCELLANEOUS) IMPLANT
BIT DRILL TRIDENT 4X25 SU (BIT) IMPLANT
BLADE SAG 18X100X1.27 (BLADE) ×2 IMPLANT
CHLORAPREP W/TINT 26 (MISCELLANEOUS) ×2 IMPLANT
CLSR STERI-STRIP ANTIMIC 1/2X4 (GAUZE/BANDAGES/DRESSINGS) ×2 IMPLANT
COVER PERINEAL POST (MISCELLANEOUS) ×2 IMPLANT
COVER SURGICAL LIGHT HANDLE (MISCELLANEOUS) ×2 IMPLANT
DRAPE IMP U-DRAPE 54X76 (DRAPES) ×2 IMPLANT
DRAPE STERI IOBAN 125X83 (DRAPES) ×2 IMPLANT
DRAPE U-SHAPE 47X51 STRL (DRAPES) ×4 IMPLANT
DRSG MEPILEX POST OP 4X8 (GAUZE/BANDAGES/DRESSINGS) ×2 IMPLANT
ELECT REM PT RETURN 15FT ADLT (MISCELLANEOUS) ×2 IMPLANT
GLOVE BIO SURGEON STRL SZ7.5 (GLOVE) ×2 IMPLANT
GLOVE BIOGEL PI IND STRL 7.5 (GLOVE) ×2 IMPLANT
GLOVE BIOGEL PI IND STRL 8 (GLOVE) ×2 IMPLANT
GLOVE SURG SYN 7.0 (GLOVE) ×1 IMPLANT
GLOVE SURG SYN 7.0 PF PI (GLOVE) ×2 IMPLANT
GOWN STRL REUS W/ TWL LRG LVL3 (GOWN DISPOSABLE) ×2 IMPLANT
GOWN STRL REUS W/ TWL XL LVL3 (GOWN DISPOSABLE) ×2 IMPLANT
HEAD BIOLOX HIP 36/-5 (Joint) IMPLANT
HIP BIOLOX HD 36/-5 (Joint) ×1 IMPLANT
HOLDER FOLEY CATH W/STRAP (MISCELLANEOUS) IMPLANT
INSERT 0 DEGREE 36 (Miscellaneous) IMPLANT
KIT TURNOVER KIT A (KITS) IMPLANT
MANIFOLD NEPTUNE II (INSTRUMENTS) ×2 IMPLANT
NS IRRIG 1000ML POUR BTL (IV SOLUTION) ×2 IMPLANT
PACK ANTERIOR HIP CUSTOM (KITS) ×2 IMPLANT
PROTECTOR NERVE ULNAR (MISCELLANEOUS) ×2 IMPLANT
SCREW HEX LP 6.5X20 (Screw) IMPLANT
SHELL TRIDENT II CLUST 50 (Shell) IMPLANT
SPIKE FLUID TRANSFER (MISCELLANEOUS) ×4 IMPLANT
STEM STD OFFSET SZ5 36 (Stem) IMPLANT
STRIP CLOSURE SKIN 1/2X4 (GAUZE/BANDAGES/DRESSINGS) IMPLANT
SUT MNCRL AB 3-0 PS2 18 (SUTURE) ×2 IMPLANT
SUT VIC AB 0 CT1 36 (SUTURE) ×2 IMPLANT
SUT VIC AB 1 CT1 36 (SUTURE) ×2 IMPLANT
SUT VIC AB 2-0 CT1 TAPERPNT 27 (SUTURE) ×2 IMPLANT
TRAY FOLEY SLVR 14FR TEMP STAT (SET/KITS/TRAYS/PACK) IMPLANT
TUBE SUCTION HIGH CAP CLEAR NV (SUCTIONS) ×2 IMPLANT
WATER STERILE IRR 1000ML POUR (IV SOLUTION) ×4 IMPLANT

## 2023-11-24 NOTE — Evaluation (Signed)
Physical Therapy Evaluation Patient Details Name: Laura Page MRN: [id number] DOB: Jan 22, 1956 Today's Date: 11/24/2023  History of Present Illness  Laura Page is 68 yo female admitted on 11/24/23 for L anterior THA.  Laura Page with hx including but not limited to OA, L TSA, GERD  Clinical Impression  Laura Page is s/p THA resulting in the deficits listed below (see Laura Page Problem List). At baseline, Laura Page is independent.  She has home support.  Today, Laura Page was seen in PACU for possible same day discharge but was limited by pain in hip and bladder. She did ambulate 40'x2.  She was unable to urinate during session and with significant bladder pain so held stairs and exercise.  Returned Laura Page to bed - RN aware and discussing possible in/out cath with Laura Page.  Plan to follow up later this afternoon.  Laura Page will benefit from acute skilled Laura Page to increase their independence and safety with mobility to facilitate discharge.          If plan is discharge home, recommend the following: A little help with walking and/or transfers;A little help with bathing/dressing/bathroom;Assistance with cooking/housework;Help with stairs or ramp for entrance   Can travel by private vehicle        Equipment Recommendations Rolling walker (2 wheels)  Recommendations for Other Services       Functional Status Assessment Patient has had a recent decline in their functional status and demonstrates the ability to make significant improvements in function in a reasonable and predictable amount of time.     Precautions / Restrictions Precautions Precautions: Fall Restrictions Weight Bearing Restrictions Per Provider Order: Yes LLE Weight Bearing Per Provider Order: Weight bearing as tolerated      Mobility  Bed Mobility Overal bed mobility: Needs Assistance Bed Mobility: Supine to Sit, Sit to Supine     Supine to sit: Min assist Sit to supine: Min assist   General bed mobility comments: Min A for L LE    Transfers Overall transfer  level: Needs assistance Equipment used: Rolling walker (2 wheels) Transfers: Sit to/from Stand Sit to Stand: Min assist           General transfer comment: STS from stretcher x 1, chair x2, toilet x 3; light min A to rise with cues for L LE management    Ambulation/Gait Ambulation/Gait assistance: Contact guard assist Gait Distance (Feet): 40 Feet (40'x2) Assistive device: Rolling walker (2 wheels) Gait Pattern/deviations: Step-to pattern, Decreased stride length, Decreased weight shift to left Gait velocity: decreased     General Gait Details: Cues for sequencing and RW proximity with good carryover  Stairs Stairs:  (held due to pain)          Wheelchair Mobility     Tilt Bed    Modified Rankin (Stroke Patients Only)       Balance Overall balance assessment: Needs assistance Sitting-balance support: No upper extremity supported Sitting balance-Leahy Scale: Good     Standing balance support: Bilateral upper extremity supported, Reliant on assistive device for balance Standing balance-Leahy Scale: Poor Standing balance comment: RW and CGA                             Pertinent Vitals/Pain Pain Assessment Pain Assessment: 0-10 Pain Score: 8  Pain Location: L anterior hip and radiates down; and lower abdomen/bladder pain Pain Descriptors / Indicators: Sharp, Throbbing Pain Intervention(s): Limited activity within patient's tolerance, Premedicated before session, Ice applied, Monitored during session  Home Living Family/patient expects to be discharged to:: Private residence Living Arrangements: Spouse/significant other Available Help at Discharge: Family;Available 24 hours/day Type of Home: House Home Access: Stairs to enter Entrance Stairs-Rails: None Entrance Stairs-Number of Steps: 3   Home Layout: One level Home Equipment: Agricultural consultant (2 wheels)      Prior Function Prior Level of Function : Independent/Modified  Independent;Working/employed                     Extremity/Trunk Assessment   Upper Extremity Assessment Upper Extremity Assessment: Overall WFL for tasks assessed    Lower Extremity Assessment Lower Extremity Assessment: LLE deficits/detail;RLE deficits/detail RLE Deficits / Details: ROM WFL; MMT: 5/5 LLE Deficits / Details: Expected post op changes; ROM WFL; MMT: ankle 5/5, knee 3/5, hip 1/5    Cervical / Trunk Assessment Cervical / Trunk Assessment: Normal  Communication      Cognition Arousal: Alert Behavior During Therapy: WFL for tasks assessed/performed Overall Cognitive Status: Within Functional Limits for tasks assessed                                          General Comments General comments (skin integrity, edema, etc.): VSS.  Laura Page with significant lower abdominal pain.  Nursing reports bladder scan with 500 mL and unable to urinate.  Attempted walking to bathroom, turning on water, multiple transfers but Laura Page unable to urinate.  She was also still numb in perianal area.  Laura Page unable to complete stair training or further exercise due to pain.  RN aware and following.    Exercises     Assessment/Plan    Laura Page Assessment Patient needs continued Laura Page services  Laura Page Problem List Decreased strength;Pain;Decreased range of motion;Decreased activity tolerance;Decreased balance;Decreased mobility;Decreased knowledge of use of DME       Laura Page Treatment Interventions DME instruction;Therapeutic exercise;Gait training;Stair training;Functional mobility training;Therapeutic activities;Patient/family education;Balance training;Modalities    Laura Page Goals (Current goals can be found in the Care Plan section)  Acute Rehab Laura Page Goals Patient Stated Goal: return home Laura Page Goal Formulation: With patient/family Time For Goal Achievement: 12/08/23 Potential to Achieve Goals: Good    Frequency 7X/week     Co-evaluation               AM-PAC Laura Page "6 Clicks" Mobility   Outcome Measure Help needed turning from your back to your side while in a flat bed without using bedrails?: A Little Help needed moving from lying on your back to sitting on the side of a flat bed without using bedrails?: A Little Help needed moving to and from a bed to a chair (including a wheelchair)?: A Little Help needed standing up from a chair using your arms (e.g., wheelchair or bedside chair)?: A Little Help needed to walk in hospital room?: A Little Help needed climbing 3-5 steps with a railing? : A Lot 6 Click Score: 17    End of Session Equipment Utilized During Treatment: Gait belt Activity Tolerance: Patient limited by pain Patient left: in bed;with call bell/phone within reach;with family/visitor present Nurse Communication: Mobility status Laura Page Visit Diagnosis: Other abnormalities of gait and mobility (R26.89);Muscle weakness (generalized) (M62.81)    Time: 5638-7564 Laura Page Time Calculation (min) (ACUTE ONLY): 41 min   Charges:   Laura Page Evaluation $Laura Page Eval Low Complexity: 1 Low Laura Page Treatments $Gait Training: 8-22 mins $Therapeutic Activity: 8-22 mins Laura Page General Charges $$ ACUTE Laura Page  VISIT: 1 Visit         Laura Page, Laura Page Acute Rehab Services [facility] [phone]   Laura Page 11/24/2023, 2:10 PM

## 2023-11-24 NOTE — Op Note (Signed)
11/24/2023  8:57 AM  PATIENT:  Laura Page   MRN: [id number]  PRE-OPERATIVE DIAGNOSIS:  OA LEFT HIP  POST-OPERATIVE DIAGNOSIS:  OA LEFT HIP  PROCEDURE:  Procedure(s): TOTAL HIP ARTHROPLASTY ANTERIOR APPROACH  PREOPERATIVE INDICATIONS:    Laura Page is an 68 y.o. female who has a diagnosis of <principal problem not specified> and elected for surgical management after failing conservative treatment.  The risks benefits and alternatives were discussed with the patient including but not limited to the risks of nonoperative treatment, versus surgical intervention including infection, bleeding, nerve injury, periprosthetic fracture, the need for revision surgery, dislocation, leg length discrepancy, blood clots, cardiopulmonary complications, morbidity, mortality, among others, and they were willing to proceed.     OPERATIVE REPORT     SURGEON:   Sheral Apley, MD    ASSISTANT:  Levester Fresh, PA-C, she was present and scrubbed throughout the case, critical for completion in a timely fashion, and for retraction, instrumentation, and closure.     ANESTHESIA:  General    COMPLICATIONS:  None.     COMPONENTS:  Stryker insignia femur size 5 with a 36 mm -5 head ball and an acetabular shell size 50 with a  polyethylene liner    PROCEDURE IN DETAIL:   The patient was met in the holding area and  identified.  The appropriate hip was identified and marked at the operative site.  The patient was then transported to the OR  and  placed under anesthesia per that record.  At that point, the patient was  placed in the supine position and  secured to the operating room table and all bony prominences padded. He received pre-operative antibiotics    The operative lower extremity was prepped from the iliac crest to the distal leg.  Sterile draping was performed.  Time out was performed prior to incision.      Skin incision was made just 2 cm lateral to the ASIS  extending in line  with the tensor fascia lata. Electrocautery was used to control all bleeders. I dissected down sharply to the fascia of the tensor fascia lata was confirmed that the muscle fibers beneath were running posteriorly. I then incised the fascia over the superficial tensor fascia lata in line with the incision. The fascia was elevated off the anterior aspect of the muscle the muscle was retracted posteriorly and protected throughout the case. I then used electrocautery to incise the tensor fascia lata fascia control and all bleeders. Immediately visible was the fat over top of the anterior neck and capsule.  I removed the anterior fat from the capsule and elevated the rectus muscle off of the anterior capsule. I then removed a large time of capsule. The retractors were then placed over the anterior acetabulum as well as around the superior and inferior neck.  I then made a femoral neck cut. Then used the power corkscrew to remove the femoral head from the acetabulum and thoroughly irrigated the acetabulum. I sized the femoral head.    I then exposed the deep acetabulum, cleared out any tissue including the ligamentum teres.   After adequate visualization, I excised the labrum, and then sequentially reamed.  I then impacted the acetabular implant into place using fluoroscopy for guidance.  Appropriate version and inclination was confirmed clinically matching their bony anatomy, and with fluoroscopy.  I placed a 20 mm screw in the posterior/superio position with an excellent bite.    I then placed the polyethylene liner in place  I then adducted the leg and released the external rotators from the posterior femur allowing it to be easily delivered up lateral and anterior to the acetabulum for preparation of the femoral canal.    I then prepared the proximal femur using the cookie-cutter and then sequentially reamed and broached.  A trial broach, neck, and head was utilized, and I reduced the hip and used  floroscopy to assess the neck length and femoral implant.  I then impacted the femoral prosthesis into place into the appropriate version. The hip was then reduced and fluoroscopy confirmed appropriate position. Leg lengths were restored.  I then irrigated the hip copiously again with, and repaired the fascia with Vicryl, followed by monocryl for the subcutaneous tissue, Monocryl for the skin, Steri-Strips and sterile gauze. The patient was then awakened and returned to PACU in stable and satisfactory condition. There were no complications.  POST OPERATIVE PLAN: WBAT, DVT px: SCD's/TED, ambulation and chemical dvt px  Margarita Rana, MD Orthopedic Surgeon [phone]

## 2023-11-24 NOTE — Anesthesia Procedure Notes (Signed)
Procedure Name: MAC Date/Time: 11/24/2023 7:34 AM  Performed by: Dennison Nancy, CRNAPre-anesthesia Checklist: Patient identified, Emergency Drugs available, Suction available, Patient being monitored and Timeout performed Patient Re-evaluated:Patient Re-evaluated prior to induction Oxygen Delivery Method: Simple face mask Dental Injury: Teeth and Oropharynx as per pre-operative assessment

## 2023-11-24 NOTE — Anesthesia Procedure Notes (Signed)
Spinal  Patient location during procedure: OR Start time: 11/24/2023 7:30 AM End time: 11/24/2023 7:36 AM Reason for block: surgical anesthesia Staffing Performed: anesthesiologist  Anesthesiologist: Trevor Iha, MD Performed by: Trevor Iha, MD Authorized by: Trevor Iha, MD   Preanesthetic Checklist Completed: patient identified, IV checked, risks and benefits discussed, surgical consent, monitors and equipment checked, pre-op evaluation and timeout performed Spinal Block Patient position: sitting Prep: DuraPrep and site prepped and draped Patient monitoring: heart rate, cardiac monitor, continuous pulse ox and blood pressure Approach: midline Location: L2-3 Injection technique: single-shot Needle Needle type: Pencan  Needle gauge: 24 G Needle length: 10 cm Needle insertion depth: 7 cm Assessment Sensory level: T4 Events: CSF return Additional Notes 3 Attempt (s). Pt tolerated procedure well.

## 2023-11-24 NOTE — Transfer of Care (Signed)
Immediate Anesthesia Transfer of Care Note  Patient: Laura Page  Procedure(s) Performed: TOTAL HIP ARTHROPLASTY ANTERIOR APPROACH (Left: Hip)  Patient Location: PACU  Anesthesia Type:MAC and Spinal  Level of Consciousness: awake, alert , oriented, and patient cooperative  Airway & Oxygen Therapy: Patient Spontanous Breathing and Patient connected to face mask oxygen  Post-op Assessment: Report given to RN and Post -op Vital signs reviewed and stable  Post vital signs: Reviewed and stable  Last Vitals:  Vitals Value Taken Time  BP 111/72 11/24/23 0932  Temp    Pulse 76 11/24/23 0937  Resp 24 11/24/23 0937  SpO2 96 % 11/24/23 0937  Vitals shown include unfiled device data.  Last Pain:  Vitals:   11/24/23 0625  TempSrc: Oral  PainSc:          Complications: No notable events documented.

## 2023-11-24 NOTE — Interval H&P Note (Signed)
History and Physical Interval Note:  11/24/2023 7:17 AM  Laura Page  has presented today for surgery, with the diagnosis of OA LEFT HIP.  The various methods of treatment have been discussed with the patient and family. After consideration of risks, benefits and other options for treatment, the patient has consented to  Procedure(s): TOTAL HIP ARTHROPLASTY ANTERIOR APPROACH (Left) as a surgical intervention.  The patient's history has been reviewed, patient examined, no change in status, stable for surgery.  I have reviewed the patient's chart and labs.  Questions were answered to the patient's satisfaction.     Sheral Apley

## 2023-11-24 NOTE — Progress Notes (Signed)
Physical Therapy Treatment Patient Details Name: Laura Page MRN: [id number] DOB: 1956/10/31 Today's Date: 11/24/2023   History of Present Illness Pt is 68 yo female admitted on 11/24/23 for L anterior THA.  Pt with hx including but not limited to OA, L TSA, GERD    PT Comments  Pt is POD # 0 and is progressing well. Pt seen for second session in PACU.  She had been able to urinate and abdominal pain improved.  Still rating hip pain at 7-8/10 but tolerated 80' of ambulation and performed stairs similar to home set up with spouse assisting and cueing.  Pt somewhat concerned about pain control - discussed only doing exercises as tolerated, walking every 1-2 hours, taking pain meds to "stay on top" of pain, use of ice, and positions of comfort for rest.  Pt educated on f/u with MD if pain not improving.  Pt demonstrates safe gait & transfers in order to return home from PT perspective once discharged by MD.  While in hospital, will continue to benefit from PT for skilled therapy to advance mobility and exercises.       If plan is discharge home, recommend the following: A little help with walking and/or transfers;A little help with bathing/dressing/bathroom;Assistance with cooking/housework;Help with stairs or ramp for entrance   Can travel by private vehicle        Equipment Recommendations  Rolling walker (2 wheels)    Recommendations for Other Services       Precautions / Restrictions Precautions Precautions: Fall Restrictions Weight Bearing Restrictions Per Provider Order: Yes LLE Weight Bearing Per Provider Order: Weight bearing as tolerated     Mobility  Bed Mobility Overal bed mobility: Needs Assistance Bed Mobility: Supine to Sit, Sit to Supine     Supine to sit: Min assist Sit to supine: Min assist   General bed mobility comments: Min A for L LE    Transfers Overall transfer level: Needs assistance Equipment used: Rolling walker (2 wheels) Transfers: Sit  to/from Stand Sit to Stand: Contact guard assist           General transfer comment: STS from stjretcher and toilet    Ambulation/Gait Ambulation/Gait assistance: Contact guard assist Gait Distance (Feet): 80 Feet Assistive device: Rolling walker (2 wheels) Gait Pattern/deviations: Step-to pattern, Decreased stride length, Decreased weight shift to left Gait velocity: decreased     General Gait Details: Cues for sequencing and RW proximity with good carryover   Stairs Stairs: Yes Stairs assistance: Contact guard assist Stair Management: With cane, No rails, Forwards Number of Stairs: 4 General stair comments: Started with backward with RW but pt not comfortable. Switched on cane in R hand and HHA on L - tolerated well with therapist then progressed to spouse assisting.  Spouse able to assist and provide cues   Wheelchair Mobility     Tilt Bed    Modified Rankin (Stroke Patients Only)       Balance Overall balance assessment: Needs assistance Sitting-balance support: No upper extremity supported Sitting balance-Leahy Scale: Good     Standing balance support: Bilateral upper extremity supported, Reliant on assistive device for balance, No upper extremity supported Standing balance-Leahy Scale: Fair Standing balance comment: RW to ambulate -  steady; could stand without UE support with ADLs                            Cognition Arousal: Alert Behavior During Therapy: [facility] for tasks assessed/performed  Overall Cognitive Status: Within Functional Limits for tasks assessed                                          Exercises Total Joint Exercises Ankle Circles/Pumps: AROM, Both, 10 reps, Supine Quad Sets: AROM, Both, 10 reps, Supine Heel Slides: AAROM, Left, 5 reps, Supine Hip ABduction/ADduction: AAROM, Left, 5 reps, Supine Long Arc Quad: AROM, Left, 5 reps, Seated Other Exercises Other Exercises: held on standing exercises as pt had  outpt scheduled Friday    General Comments General comments (skin integrity, edema, etc.): VSS Educated on safe ice use, no pivots, car transfers,. Also, encouraged walking every 1-2 hours during day. Educated on HEP with focus on mobility the first weeks. Discussed doing exercises within pain control and if pain increasing could decreased ROM, reps, and stop exercises as needed. Encouraged to perform  ankle pumps frequently for blood flow .      Pertinent Vitals/Pain Pain Assessment Pain Assessment: 0-10 Pain Score: 8  Pain Location: L hip Pain Descriptors / Indicators: Aching Pain Intervention(s): Limited activity within patient's tolerance, Monitored during session, Premedicated before session, Repositioned, Ice applied    Home Living Family/patient expects to be discharged to:: Private residence Living Arrangements: Spouse/significant other Available Help at Discharge: Family;Available 24 hours/day Type of Home: House Home Access: Stairs to enter Entrance Stairs-Rails: None Entrance Stairs-Number of Steps: 3   Home Layout: One level Home Equipment: Agricultural consultant (2 wheels)      Prior Function            PT Goals (current goals can now be found in the care plan section) Acute Rehab PT Goals Patient Stated Goal: return home PT Goal Formulation: With patient/family Time For Goal Achievement: 12/08/23 Potential to Achieve Goals: Good Progress towards PT goals: Progressing toward goals    Frequency    7X/week      PT Plan      Co-evaluation              AM-PAC PT "6 Clicks" Mobility   Outcome Measure  Help needed turning from your back to your side while in a flat bed without using bedrails?: A Little Help needed moving from lying on your back to sitting on the side of a flat bed without using bedrails?: A Little Help needed moving to and from a bed to a chair (including a wheelchair)?: A Little Help needed standing up from a chair using your arms  (e.g., wheelchair or bedside chair)?: A Little Help needed to walk in hospital room?: A Little Help needed climbing 3-5 steps with a railing? : A Little 6 Click Score: 18    End of Session Equipment Utilized During Treatment: Gait belt Activity Tolerance: Patient tolerated treatment well Patient left: in bed;with call bell/phone within reach;with family/visitor present Nurse Communication: Mobility status PT Visit Diagnosis: Other abnormalities of gait and mobility (R26.89);Muscle weakness (generalized) (M62.81)     Time: 4098-1191 PT Time Calculation (min) (ACUTE ONLY): 50 min  Charges:    $Gait Training: 8-22 mins $Therapeutic Exercise: 8-22 mins $Therapeutic Activity: 8-22 mins PT General Charges $$ ACUTE PT VISIT: 1 Visit                     Anise Salvo, PT Acute Rehab [facility] [phone]    Rayetta Humphrey 11/24/2023, 4:50 PM

## 2023-11-24 NOTE — Anesthesia Postprocedure Evaluation (Signed)
Anesthesia Post Note  Patient: NINEL HOSKIE  Procedure(s) Performed: TOTAL HIP ARTHROPLASTY ANTERIOR APPROACH (Left: Hip)     Patient location during evaluation: Nursing Unit Anesthesia Type: Spinal Level of consciousness: oriented and awake and alert Pain management: pain level controlled Vital Signs Assessment: post-procedure vital signs reviewed and stable Respiratory status: spontaneous breathing and respiratory function stable Cardiovascular status: blood pressure returned to baseline and stable Postop Assessment: no headache, no backache, no apparent nausea or vomiting and patient able to bend at knees Anesthetic complications: no   No notable events documented.  Last Vitals:  Vitals:   11/24/23 1030 11/24/23 1045  BP: 128/77 122/79  Pulse: 60 64  Resp: 15 17  Temp: 36.5 C   SpO2: 100% 100%    Last Pain:  Vitals:   11/24/23 1045  TempSrc:   PainSc: 0-No pain                 Trevor Iha

## 2023-11-24 NOTE — Discharge Instructions (Addendum)

## 2023-11-25 ENCOUNTER — Encounter (HOSPITAL_COMMUNITY): Payer: Self-pay | Admitting: Orthopedic Surgery

## 2023-11-27 DIAGNOSIS — M25552 Pain in left hip: Secondary | ICD-10-CM | POA: Diagnosis not present

## 2023-11-27 DIAGNOSIS — Z4789 Encounter for other orthopedic aftercare: Secondary | ICD-10-CM | POA: Diagnosis not present

## 2023-12-01 DIAGNOSIS — M25552 Pain in left hip: Secondary | ICD-10-CM | POA: Diagnosis not present

## 2023-12-01 DIAGNOSIS — Z4789 Encounter for other orthopedic aftercare: Secondary | ICD-10-CM | POA: Diagnosis not present

## 2023-12-03 DIAGNOSIS — M25552 Pain in left hip: Secondary | ICD-10-CM | POA: Diagnosis not present

## 2023-12-03 DIAGNOSIS — Z4789 Encounter for other orthopedic aftercare: Secondary | ICD-10-CM | POA: Diagnosis not present

## 2023-12-07 DIAGNOSIS — Z4789 Encounter for other orthopedic aftercare: Secondary | ICD-10-CM | POA: Diagnosis not present

## 2023-12-07 DIAGNOSIS — M25552 Pain in left hip: Secondary | ICD-10-CM | POA: Diagnosis not present

## 2023-12-09 DIAGNOSIS — M1612 Unilateral primary osteoarthritis, left hip: Secondary | ICD-10-CM | POA: Diagnosis not present

## 2023-12-10 DIAGNOSIS — M25552 Pain in left hip: Secondary | ICD-10-CM | POA: Diagnosis not present

## 2023-12-10 DIAGNOSIS — Z4789 Encounter for other orthopedic aftercare: Secondary | ICD-10-CM | POA: Diagnosis not present

## 2023-12-15 DIAGNOSIS — Z4789 Encounter for other orthopedic aftercare: Secondary | ICD-10-CM | POA: Diagnosis not present

## 2023-12-15 DIAGNOSIS — M25552 Pain in left hip: Secondary | ICD-10-CM | POA: Diagnosis not present

## 2023-12-17 DIAGNOSIS — Z4789 Encounter for other orthopedic aftercare: Secondary | ICD-10-CM | POA: Diagnosis not present

## 2023-12-17 DIAGNOSIS — M25552 Pain in left hip: Secondary | ICD-10-CM | POA: Diagnosis not present

## 2023-12-22 DIAGNOSIS — Z4789 Encounter for other orthopedic aftercare: Secondary | ICD-10-CM | POA: Diagnosis not present

## 2023-12-22 DIAGNOSIS — M25552 Pain in left hip: Secondary | ICD-10-CM | POA: Diagnosis not present

## 2023-12-24 DIAGNOSIS — Z4789 Encounter for other orthopedic aftercare: Secondary | ICD-10-CM | POA: Diagnosis not present

## 2023-12-24 DIAGNOSIS — M25552 Pain in left hip: Secondary | ICD-10-CM | POA: Diagnosis not present

## 2023-12-29 DIAGNOSIS — Z4789 Encounter for other orthopedic aftercare: Secondary | ICD-10-CM | POA: Diagnosis not present

## 2023-12-29 DIAGNOSIS — M25552 Pain in left hip: Secondary | ICD-10-CM | POA: Diagnosis not present

## 2023-12-31 DIAGNOSIS — Z4789 Encounter for other orthopedic aftercare: Secondary | ICD-10-CM | POA: Diagnosis not present

## 2023-12-31 DIAGNOSIS — M25552 Pain in left hip: Secondary | ICD-10-CM | POA: Diagnosis not present

## 2024-01-06 DIAGNOSIS — M1612 Unilateral primary osteoarthritis, left hip: Secondary | ICD-10-CM | POA: Diagnosis not present

## 2024-04-08 DIAGNOSIS — Z1211 Encounter for screening for malignant neoplasm of colon: Secondary | ICD-10-CM | POA: Diagnosis not present

## 2024-04-08 DIAGNOSIS — Z1231 Encounter for screening mammogram for malignant neoplasm of breast: Secondary | ICD-10-CM | POA: Diagnosis not present

## 2024-04-08 DIAGNOSIS — Z0000 Encounter for general adult medical examination without abnormal findings: Secondary | ICD-10-CM | POA: Diagnosis not present

## 2024-04-08 DIAGNOSIS — E785 Hyperlipidemia, unspecified: Secondary | ICD-10-CM | POA: Diagnosis not present

## 2024-04-08 DIAGNOSIS — Z13820 Encounter for screening for osteoporosis: Secondary | ICD-10-CM | POA: Diagnosis not present

## 2024-04-25 ENCOUNTER — Other Ambulatory Visit: Payer: Medicare HMO

## 2024-04-25 ENCOUNTER — Ambulatory Visit
Admission: RE | Admit: 2024-04-25 | Discharge: 2024-04-25 | Disposition: A | Discharge status: Home / Self Care | Payer: Medicare HMO | Source: Ambulatory Visit | Attending: Family Medicine | Admitting: Family Medicine

## 2024-04-25 DIAGNOSIS — Z1231 Encounter for screening mammogram for malignant neoplasm of breast: Secondary | ICD-10-CM | POA: Diagnosis not present

## 2024-05-12 ENCOUNTER — Ambulatory Visit (HOSPITAL_BASED_OUTPATIENT_CLINIC_OR_DEPARTMENT_OTHER)
Admission: RE | Admit: 2024-05-12 | Discharge: 2024-05-12 | Disposition: A | Discharge status: Home / Self Care | Source: Ambulatory Visit | Attending: Family Medicine | Admitting: Family Medicine

## 2024-05-12 DIAGNOSIS — Z13820 Encounter for screening for osteoporosis: Secondary | ICD-10-CM | POA: Insufficient documentation

## 2024-05-12 DIAGNOSIS — Z780 Asymptomatic menopausal state: Secondary | ICD-10-CM | POA: Diagnosis not present

## 2024-07-06 DIAGNOSIS — Z6836 Body mass index (BMI) 36.0-36.9, adult: Secondary | ICD-10-CM | POA: Diagnosis not present

## 2024-07-06 DIAGNOSIS — R413 Other amnesia: Secondary | ICD-10-CM | POA: Diagnosis not present

## 2024-09-08 DIAGNOSIS — Z6836 Body mass index (BMI) 36.0-36.9, adult: Secondary | ICD-10-CM | POA: Diagnosis not present

## 2024-09-08 DIAGNOSIS — L243 Irritant contact dermatitis due to cosmetics: Secondary | ICD-10-CM | POA: Diagnosis not present

## 2024-09-19 DIAGNOSIS — M542 Cervicalgia: Secondary | ICD-10-CM | POA: Diagnosis not present

## 2024-09-19 DIAGNOSIS — Z6836 Body mass index (BMI) 36.0-36.9, adult: Secondary | ICD-10-CM | POA: Diagnosis not present

## 2024-09-19 DIAGNOSIS — G44209 Tension-type headache, unspecified, not intractable: Secondary | ICD-10-CM | POA: Diagnosis not present

## 2024-11-10 ENCOUNTER — Encounter: Payer: Self-pay | Admitting: Neurology

## 2024-11-10 ENCOUNTER — Ambulatory Visit: Admitting: Neurology

## 2024-11-10 VITALS — BP 138/84 | HR 101 | Ht 67.0 in | Wt 237.5 lb

## 2024-11-10 DIAGNOSIS — R4189 Other symptoms and signs involving cognitive functions and awareness: Secondary | ICD-10-CM | POA: Diagnosis not present

## 2024-11-10 NOTE — Progress Notes (Signed)
 Patient   [facility]  PATIENT: Laura Page DOB: [birth year]  REQUESTING CLINICIAN: Mickie Kaufman, MD HISTORY FROM: Patient and husband REASON FOR VISIT: Memory loss   HISTORICAL  CHIEF COMPLAINT:  Chief Complaint  Patient presents with   New Patient (Initial Visit)    Room13 With Husband Referral for Memory changes Moca completed: 23    HISTORY OF PRESENT ILLNESS:  Discussed the use of AI scribe software for clinical note transcription with the patient, who gave verbal consent to proceed.  Laura Page is a 69 year old female with history of obesity, chronic pain, GERD who presents with memory concerns and cognitive decline.  She has been experiencing memory issues and cognitive decline over the past year, characterized by absent-mindedness and difficulty recalling tasks, such as forgetting items on a grocery list or the purpose of entering a room. These symptoms began after her hip replacement surgery in January 2021, with persistent 'brain fog' post-anesthesia.  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  She manages daily activities at home by relying on familiar surroundings and routines. She does not use GPS for navigation but uses the a list for grocery shopping to compensate for memory lapses. She has not experienced getting lost or missing important appointments.  Her current medications include a multivitamin for women over 50, extra zinc, and omeprazole for GERD. She does not take any prescription medications for cognitive enhancement or other chronic conditions.  Family history is significant for cognitive decline; her father experienced similar symptoms in his seventies and was found to have brain atrophy. Her mother developed  dementia following a major health episode at age 68.  No history of stroke, seizures, or significant anxiety or depression requiring medication. She reports good sleep quality but regular snoring, confirmed by her husband. She has experienced vertigo for the past decade, which occasionally causes dizziness.    TBI: Yes in high school  Stroke:   no past history of stroke Seizures:  no past history of seizures Sleep:  no history of sleep apnea.  Mood: patient denies anxiety and depression Family history of Dementia: Both parents   Functional status: independent in all ADLs and IADLs Patient lives with husband. Cooking: No issues Cleaning: No issues Shopping: Yes with a list Bathing: No issues Toileting: No issues Driving: No issues, local driving, does not use GPS Bills: No issues Medications: No issues Ever left the stove on by accident?:  Denies Forget how to use items around the house?:  Denies Getting lost going to familiar places?:  Denies Forgetting loved ones names?: Denies Word finding difficulty? Yes Sleep: Good    OTHER MEDICAL CONDITIONS: GERD, obesity   REVIEW OF SYSTEMS: Full 14 system review of systems performed and negative with exception of: As noted in HPI  ALLERGIES: Allergies[1]  HOME MEDICATIONS: Outpatient Medications Prior to Visit  Medication Sig Dispense Refill   acetaminophen  (TYLENOL ) 500 MG tablet Take 2 tablets (1,000 mg total) by mouth every 6 (six) hours as needed for mild pain (pain score 1-3) or moderate pain (pain score 4-6). 60 tablet 0   Apoaequorin (PREVAGEN PO) Take 1 tablet by mouth in the morning.     aspirin  EC 81 MG tablet Take 1 tablet (81 mg total) by mouth 2 (two) times daily. To prevent blood clots for  30 days after surgery. 60 tablet 0   Bioflavonoid Products (VITAMIN C) CHEW Chew 2 tablets by mouth in the morning. Vitafusion Power C Gummies     Calcium-Phosphorus-Vitamin D (CALCIUM GUMMIES PO) Take 1 Dose by mouth in the  morning. Vitafusion Calcium Supplement Gummy     Multiple Vitamin (MULTIVITAMIN WITH MINERALS) TABS tablet Take 1 tablet by mouth in the morning. Centrum Silver Multivitamin Women 50+     Cyanocobalamin  (VITAMIN B12) 1000 MCG TABS 1 tablet Orally Once a day; Duration: 30 day(s)     meloxicam  (MOBIC ) 15 MG tablet Take 1 tablet (15 mg total) by mouth daily as needed for pain (and inflammation). 30 tablet 0   methocarbamol  (ROBAXIN -750) 750 MG tablet Take 1 tablet (750 mg total) by mouth every 8 (eight) hours as needed for muscle spasms. 20 tablet 0   omeprazole (PRILOSEC) 20 MG capsule Take 20 mg by mouth daily before breakfast.     ondansetron  (ZOFRAN -ODT) 4 MG disintegrating tablet Take 1 tablet (4 mg total) by mouth every 8 (eight) hours as needed for nausea or vomiting. 15 tablet 0   oxyCODONE  (ROXICODONE ) 5 MG immediate release tablet Take 1 tablet (5 mg total) by mouth every 4 (four) hours as needed for severe pain (pain score 7-10). 30 tablet 0   sennosides-docusate sodium  (SENOKOT-S) 8.6-50 MG tablet Take 2 tablets by mouth daily as needed for constipation (while taking narcotics). 30 tablet 1   No facility-administered medications prior to visit.    PAST MEDICAL HISTORY: 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    PAST SURGICAL HISTORY: Past Surgical History:  Procedure Laterality Date   BILATERAL SALPINGOOPHORECTOMY  12/17/2009   CHOLECYSTECTOMY N/A 10/19/2019   Procedure: LAPAROSCOPIC CHOLECYSTECTOMY WITH INTRAOPERATIVE CHOLANGIOGRAM;  Surgeon: Tanda Locus, MD;  Location: [facility];  Service: General;  Laterality: N/A;   HYSTEROSCOPY W/ ENDOMETRIAL ABLATION  06/09/2008   SUPRACERVICAL ABDOMINAL HYSTERECTOMY  12/17/2009   TOTAL HIP ARTHROPLASTY Left 11/24/2023   Procedure: TOTAL HIP ARTHROPLASTY ANTERIOR APPROACH;  Surgeon:  Beverley Evalene BIRCH, MD;  Location: [facility];  Service: Orthopedics;  Laterality: Left;   TOTAL SHOULDER ARTHROPLASTY Left 11/27/2016   Procedure: TOTAL SHOULDER ARTHROPLASTY;  Surgeon: Toribio JULIANNA Beverley, MD;  Location: [facility];  Service: Orthopedics;  Laterality: Left;    FAMILY HISTORY: Family History  Problem Relation Age of Onset   Cancer Mother    Breast cancer Mother 19   Stroke Father     SOCIAL HISTORY: Social History   Socioeconomic History   Marital status: Married    Spouse name: Not on file   Number of children: Not on file   Years of education: Not on file   Highest education level: Not on file  Occupational History   Not on file  Tobacco Use   Smoking status: Former    Current packs/day: 0.00    Types: Cigarettes    Quit date: 11/02/2006    Years since quitting: 18.0   Smokeless tobacco: Never  Vaping Use   Vaping status: Never Used  Substance and Sexual Activity   Alcohol use: No   Drug use: No   Sexual activity: Yes  Other Topics Concern   Not on file  Social History Narrative   Not on file   Social Drivers of  Health   Tobacco Use: Medium Risk (11/10/2024)   Patient History    Smoking Tobacco Use: Former    Smokeless Tobacco Use: Never    Passive Exposure: Not on Actuary Strain: Not on file  Food Insecurity: Not on file  Transportation Needs: Not on file  Physical Activity: Not on file  Stress: Not on file  Social Connections: Not on file  Intimate Partner Violence: Not on file  Depression (EYV7-0): Not on file  Alcohol Screen: Not on file  Housing: Not on file  Utilities: Not on file  Health Literacy: Not on file    PHYSICAL EXAM  GENERAL EXAM/CONSTITUTIONAL: Vitals:  Vitals:   11/10/24 1032  BP: 138/84  Pulse: (!) 101  SpO2: 97%  Weight: 237 lb 8 oz (107.7 kg)  Height: 5' 7 (1.702 m)   Body mass index is 37.2 kg/m. Wt Readings from Last 3 Encounters:  11/10/24 237 lb 8 oz (107.7 kg)  11/24/23  238 lb (108 kg)  11/11/23 238 lb (108 kg)   Patient is in no distress; well developed, nourished and groomed; neck is supple  MUSCULOSKELETAL: Gait, strength, tone, movements noted in Neurologic exam below  NEUROLOGIC: MENTAL STATUS:      No data to display            11/10/2024   10:34 AM  Montreal Cognitive Assessment   Visuospatial/ Executive (0/5) 3  Naming (0/3) 3  Attention: Read list of digits (0/2) 2  Attention: Read list of letters (0/1) 1  Attention: Serial 7 subtraction starting at 100 (0/3) 3  Language: Repeat phrase (0/2) 2  Language : Fluency (0/1) 1  Abstraction (0/2) 2  Delayed Recall (0/5) 0  Orientation (0/6) 6  Total 23   awake, alert, oriented to person, place and time recent and remote memory intact normal attention and concentration language fluent, comprehension intact, naming intact fund of knowledge appropriate  CRANIAL NERVE:  2nd, 3rd, 4th, 6th- visual fields full to confrontation, extraocular muscles intact, no nystagmus 5th - facial sensation symmetric 7th - facial strength symmetric 8th - hearing intact 9th - palate elevates symmetrically, uvula midline 11th - shoulder shrug symmetric 12th - tongue protrusion midline  MOTOR:  normal bulk and tone, full strength in the BUE, BLE  SENSORY:  normal and symmetric to light touch  COORDINATION:  finger-nose-finger, fine finger movements normal  GAIT/STATION:  normal   DIAGNOSTIC DATA (LABS, IMAGING, TESTING) - I reviewed patient records, labs, notes, testing and imaging myself where available.  Lab Results  Component Value Date   WBC 6.7 11/11/2023   HGB 14.4 11/11/2023   HCT 43.6 11/11/2023   MCV 89.2 11/11/2023   PLT 312 11/11/2023      Component Value Date/Time   NA 141 11/11/2023 1359   K 4.2 11/11/2023 1359   CL 106 11/11/2023 1359   CO2 26 11/11/2023 1359   GLUCOSE 129 (H) 11/11/2023 1359   BUN 18 11/11/2023 1359   CREATININE 0.69 11/11/2023 1359   CALCIUM 9.6  11/11/2023 1359   PROT 6.6 10/20/2019 0415   ALBUMIN 3.4 (L) 10/20/2019 0415   AST 174 (H) 10/20/2019 0415   ALT 258 (H) 10/20/2019 0415   ALKPHOS 188 (H) 10/20/2019 0415   BILITOT 0.7 10/20/2019 0415   GFRNONAA >60 11/11/2023 1359   GFRAA >60 10/20/2019 0415   No results found for: CHOL, HDL, LDLCALC, LDLDIRECT, TRIG, CHOLHDL No results found for: YHAJ8R No results found for: VITAMINB12 No results  found for: TSH  CT Head 01/05/2023 1. No acute intracranial abnormality.     ASSESSMENT AND PLAN  69 y.o. year old female with   Mild cognitive impairment Memory loss affecting daily activities, including forgetfulness and reliance on lists for grocery shopping. Family history of dementia. Memory test score of 23/30, slightly below normal. Differential diagnosis includes mild cognitive impairment due to Alzheimer disease because of strong family history or mild cognitive impairment from other causes. Blood test for Alzheimer's biomarkers discussed, with understanding that a positive result indicates high risk but does not confirm dementia. Emphasized importance of maintaining daily activities and mental engagement. - Ordered blood test for Alzheimer's biomarkers - Ordered thyroid and B12 levels - Encouraged 20 minutes of exercise 5 days a week - Encouraged mental exercises such as reading, crossword puzzles, and Sudoku - Discussed potential use of Privigen supplement    1. Cognitive impairment      Patient Instructions  Continue current medications Will obtain dementia lab including ATN, APO E4, B12 and TSH Discussed increased exercise, at least 20 minutes a day 5 days a week Continue to follow with PCP Return if symptoms are worse   There are well-accepted and sensible ways to reduce risk for Alzheimers disease and other degenerative brain disorders .  Exercise Daily Walk A daily 20 minute walk should be part of your routine. Disease related apathy can be a  significant roadblock to exercise and the only way to overcome this is to make it a daily routine and perhaps have a reward at the end (something your loved one loves to eat or drink perhaps) or a personal trainer coming to the home can also be very useful. Most importantly, the patient is much more likely to exercise if the caregiver / spouse does it with him/her. In general a structured, repetitive schedule is best.  General Health: Any diseases which effect your body will effect your brain such as a pneumonia, urinary infection, blood clot, heart attack or stroke. Keep contact with your primary care doctor for regular follow ups.  Sleep. A good nights sleep is healthy for the brain. Seven hours is recommended. If you have insomnia or poor sleep habits we can give you some instructions. If you have sleep apnea wear your mask.  Diet: Eating a heart healthy diet is also a good idea; fish and poultry instead of red meat, nuts (mostly non-peanuts), vegetables, fruits, olive oil or canola oil (instead of butter), minimal salt (use other spices to flavor foods), whole grain rice, bread, cereal and pasta and wine in moderation.Research is now showing that the MIND diet, which is a combination of The Mediterranean diet and the DASH diet, is beneficial for cognitive processing and longevity. Information about this diet can be found in The MIND Diet, a book by Annitta Feeling, MS, RDN, and online at wildwildscience.es  Finances, Power of [address] and Advance Directives: You should consider putting legal safeguards in place with regard to financial and medical decision making. While the spouse always has power of attorney for medical and financial issues in the absence of any form, you should consider what you want in case the spouse / caregiver is no longer around or capable of making decisions.   Orders Placed This Encounter  Procedures   ATN PROFILE   APOE Alzheimer's Risk   Vitamin  B12   TSH    No orders of the defined types were placed in this encounter.   Return if symptoms worsen or  fail to improve.  I personally spent a total of 65 minutes in the care of the patient today including preparing to see the patient, getting/reviewing separately obtained history, performing a medically appropriate exam/evaluation, counseling and educating, placing orders, documenting clinical information in the EHR, independently interpreting results, communicating results, and discussing the diagnosis of Alzheimer disease vs. MCI and evaluation, diagnosis and management.   Pastor Falling, MD 11/10/2024, 12:47 PM  [facility] [address], [address] [place], [place] [address] [phone]     [1] No Known Allergies

## 2024-11-10 NOTE — Patient Instructions (Signed)
 Continue current medications Will obtain dementia lab including ATN, APO E4, B12 and TSH Discussed increased exercise, at least 20 minutes a day 5 days a week Continue to follow with PCP Return if symptoms are worse   There are well-accepted and sensible ways to reduce risk for Alzheimers disease and other degenerative brain disorders .  Exercise Daily Walk A daily 20 minute walk should be part of your routine. Disease related apathy can be a significant roadblock to exercise and the only way to overcome this is to make it a daily routine and perhaps have a reward at the end (something your loved one loves to eat or drink perhaps) or a personal trainer coming to the home can also be very useful. Most importantly, the patient is much more likely to exercise if the caregiver / spouse does it with him/her. In general a structured, repetitive schedule is best.  General Health: Any diseases which effect your body will effect your brain such as a pneumonia, urinary infection, blood clot, heart attack or stroke. Keep contact with your primary care doctor for regular follow ups.  Sleep. A good nights sleep is healthy for the brain. Seven hours is recommended. If you have insomnia or poor sleep habits we can give you some instructions. If you have sleep apnea wear your mask.  Diet: Eating a heart healthy diet is also a good idea; fish and poultry instead of red meat, nuts (mostly non-peanuts), vegetables, fruits, olive oil or canola oil (instead of butter), minimal salt (use other spices to flavor foods), whole grain rice, bread, cereal and pasta and wine in moderation.Research is now showing that the MIND diet, which is a combination of The Mediterranean diet and the DASH diet, is beneficial for cognitive processing and longevity. Information about this diet can be found in The MIND Diet, a book by Annitta Feeling, MS, RDN, and online at wildwildscience.es  Finances, Power of  [address] and Advance Directives: You should consider putting legal safeguards in place with regard to financial and medical decision making. While the spouse always has power of attorney for medical and financial issues in the absence of any form, you should consider what you want in case the spouse / caregiver is no longer around or capable of making decisions.

## 2024-11-22 LAB — ATN PROFILE
A -- Beta-amyloid 42/40 Ratio: 0.093 — ABNORMAL LOW
Beta-amyloid 40: 218.17 pg/mL
Beta-amyloid 42: 20.26 pg/mL
N -- NfL, Plasma: 1.32 pg/mL (ref 0.00–3.65)
T -- p-tau181: 1.15 pg/mL — ABNORMAL HIGH (ref 0.00–0.97)

## 2024-11-22 LAB — APOE ALZHEIMER'S DISEASE RISK

## 2024-11-22 LAB — VITAMIN B12: Vitamin B-12: 434 pg/mL (ref 232–1245)

## 2024-11-22 LAB — TSH: TSH: 2.56 u[IU]/mL (ref 0.450–4.500)

## 2024-11-26 ENCOUNTER — Ambulatory Visit: Payer: Self-pay | Admitting: Neurology

## 2024-11-26 NOTE — Progress Notes (Signed)
 Please add patient to the cancellation list to discuss her test results and next steps in therapy.

## 2024-11-29 NOTE — Telephone Encounter (Signed)
 Spoke with the patient and scheduled a follow-up appointment for March 4th at 10:15 AM to review results with Dr. Stanley.

## 2025-01-04 ENCOUNTER — Ambulatory Visit: Admitting: Neurology
